# Patient Record
Sex: Male | Born: 1965 | Race: White | Hispanic: No | Marital: Married | Smoking: Current every day smoker
Health system: Southern US, Community
[De-identification: ages and names within clinical notes are randomized; demographics above are authoritative.]

## PROBLEM LIST (undated history)

## (undated) DIAGNOSIS — F319 Bipolar disorder, unspecified: Secondary | ICD-10-CM

---

## 2005-05-25 ENCOUNTER — Emergency Department: Payer: Self-pay | Admitting: Emergency Medicine

## 2006-07-17 ENCOUNTER — Other Ambulatory Visit: Payer: Self-pay

## 2006-07-17 ENCOUNTER — Emergency Department: Payer: Self-pay | Admitting: Emergency Medicine

## 2006-07-17 ENCOUNTER — Ambulatory Visit: Payer: Self-pay | Admitting: Internal Medicine

## 2013-09-02 LAB — CBC
HCT: 45.4 % (ref 40.0–52.0)
HGB: 15 g/dL (ref 13.0–18.0)
MCH: 28 pg (ref 26.0–34.0)
MCHC: 33 g/dL (ref 32.0–36.0)
MCV: 85 fL (ref 80–100)
Platelet: 241 10*3/uL (ref 150–440)
RBC: 5.35 10*6/uL (ref 4.40–5.90)
RDW: 13.7 % (ref 11.5–14.5)
WBC: 11.3 10*3/uL — ABNORMAL HIGH (ref 3.8–10.6)

## 2013-09-02 LAB — TROPONIN I

## 2013-09-02 LAB — BASIC METABOLIC PANEL
Anion Gap: 6 — ABNORMAL LOW (ref 7–16)
BUN: 22 mg/dL — AB (ref 7–18)
CALCIUM: 8.6 mg/dL (ref 8.5–10.1)
CO2: 28 mmol/L (ref 21–32)
Chloride: 101 mmol/L (ref 98–107)
Creatinine: 0.96 mg/dL (ref 0.60–1.30)
EGFR (African American): >60
EGFR (Non-African Amer.): >60
GLUCOSE: 99 mg/dL (ref 65–99)
OSMOLALITY: 273 (ref 275–301)
Potassium: 3.6 mmol/L (ref 3.5–5.1)
SODIUM: 135 mmol/L — AB (ref 136–145)

## 2013-09-02 LAB — PRO B NATRIURETIC PEPTIDE: B-Type Natriuretic Peptide: 15 pg/mL (ref 0–125)

## 2013-09-03 ENCOUNTER — Observation Stay: Payer: Self-pay | Admitting: Internal Medicine

## 2013-09-03 DIAGNOSIS — R079 Chest pain, unspecified: Secondary | ICD-10-CM

## 2013-09-03 LAB — TROPONIN I

## 2013-09-03 LAB — DRUG SCREEN, URINE
AMPHETAMINES, UR SCREEN: NEGATIVE (ref ?–1000)
Barbiturates, Ur Screen: NEGATIVE (ref ?–200)
Benzodiazepine, Ur Scrn: NEGATIVE (ref ?–200)
Cannabinoid 50 Ng, Ur ~~LOC~~: NEGATIVE (ref ?–50)
Cocaine Metabolite,Ur ~~LOC~~: NEGATIVE (ref ?–300)
MDMA (Ecstasy)Ur Screen: NEGATIVE (ref ?–500)
Methadone, Ur Screen: NEGATIVE (ref ?–300)
OPIATE, UR SCREEN: POSITIVE (ref ?–300)
Phencyclidine (PCP) Ur S: NEGATIVE (ref ?–25)
TRICYCLIC, UR SCREEN: NEGATIVE (ref ?–1000)

## 2013-09-03 LAB — CK TOTAL AND CKMB (NOT AT ARMC)
CK, TOTAL: 18801 U/L — AB
CK, TOTAL: 17285 U/L — AB
CK, Total: 16185 U/L — ABNORMAL HIGH
CK-MB: 5.2 ng/mL — ABNORMAL HIGH (ref 0.5–3.6)
CK-MB: 5 ng/mL — ABNORMAL HIGH (ref 0.5–3.6)
CK-MB: 4.8 ng/mL — ABNORMAL HIGH (ref 0.5–3.6)

## 2013-09-03 LAB — LIPID PANEL
Cholesterol: 196 mg/dL (ref 0–200)
HDL Cholesterol: 35 mg/dL — ABNORMAL LOW (ref 40–60)
LDL CHOLESTEROL, CALC: 140 mg/dL — AB (ref 0–100)
Triglycerides: 105 mg/dL (ref 0–200)
VLDL Cholesterol, Calc: 21 mg/dL (ref 5–40)

## 2013-09-03 LAB — WBC: WBC: 12.1 10*3/uL — AB (ref 3.8–10.6)

## 2013-09-03 LAB — SODIUM: Sodium: 139 mmol/L (ref 136–145)

## 2014-06-24 ENCOUNTER — Ambulatory Visit
Admit: 2014-06-24 | Disposition: A | Discharge status: Home / Self Care | Payer: Self-pay | Attending: Otolaryngology | Admitting: Otolaryngology

## 2014-07-10 NOTE — Discharge Summary (Signed)
PATIENT NAME:  Kyle Wood, Kyle Wood MR#:  [id number] DATE OF BIRTH:  1965/05/30  DATE OF ADMISSION:  09/03/2013 DATE OF DISCHARGE:  09/03/2013  ADMITTING DIAGNOSIS:  Chest pain.   DISCHARGE DIAGNOSES:  1.  Chest pain of unclear etiology at this time.  2.  Rhabdomyolysis due to exercise. 3.  Dehydration.  4.  Hyponatremia.  5.  Leukocytosis, might be stress reaction.  6.  Tobacco abuse.  7.  Hyperlipidemia.   DISCHARGE CONDITION:  Stable.   DISCHARGE MEDICATIONS:  The patient is to continue NicoDerm CQ 14 mg topically daily and nicotine oral inhaler 10 mg every two hours as needed.   HOME OXYGEN:  None.   DIET:  2 gram salt, low fat, low cholesterol.  The patient was advised to drink plenty of fluids over the next 2 or 3 days to have good urinary output.  Diet consistency is regular.   ACTIVITY LIMITATIONS:  As tolerated.   FOLLOWUP APPOINTMENT:  With Dr. Burnett ShengHedrick in two days after discharge.   CONSULTANTS:  Care management, social work.   RADIOLOGIC STUDIES:  Chest x-ray, portable, single view, 09/02/2013, revealed no active disease.  Myoview stress test, 09/03/2013, exercise myocardial perfusion study with no significant ischemia.  No significant wall motion abnormality noted.  Normal study with excellent exercise tolerance.  Overall this is a low risk scan.  The estimated ejection fraction was 73%.  The left ventricular global function was normal.  There were no EKG changes concerning for ischemia.  There was no artifact noted on the study.   HOSPITAL COURSE:  The patient is a 49 year old Caucasian male with no significant past medical history who presents to the hospital with complaints of chest pains.  Please refer to Dr. Jarrett SohoHower's admission note on 09/03/2013.  On arrival to the hospital, the patient's temperature was 98.8, pulse was 79, respiratory rate was 18, blood pressure 128/72, saturation was 95% on room air.  Physical exam was unremarkable.  The patient's lab data done on arrival to  the hospital 09/02/2013, showed elevation of BUN to 22, sodium 135, otherwise BMP was unremarkable.  The patient's cardiac enzymes, CK total was markedly elevated to 18,801, MB fraction was 5.2.  Troponin was less than 0.02.  The second set revealed a CK total of 17,285 with MB fraction of 5.0 and normal troponin less than 0.02.  Third set, CK total was 16,185 and MB fraction was 4.8 with again normal troponin of less than 0.02.  Urine drug screen was positive for opiates.  However, the patient's urine drug screen was performed only on 09/03/2013.  White blood cell count was slightly elevated to 11.3, hemoglobin was 15.0, platelet count was 241.  EKG showed normal sinus rhythm at 77 beats per minute, normal axis.  No acute ST-T changes were noted.  The patient was admitted to the hospital for further evaluation.  He was started on IV fluids because of rhabdomyolysis and his cardiac enzymes were cycled.  He underwent Myoview stress test on 09/03/2013 which was unremarkable for cardiac ischemia.  It was felt that the patient's chest pain was unlikely cardiac and possible musculoskeletal due to mild rhabdomyolysis.  The patient was advised to drink plenty of fluids to facilitate rhabdomyolysis recovery.  Again, urine drug screen was unremarkable.  Other risk factors for cardiac disease were investigated and the patient had lipid panel checked while he was in the hospital.  The patient's lipid panel revealed LDL of 140, total cholesterol was 196, triglycerides were 105 and  HDL was 35.  The patient was advised to continue low-fat, low-cholesterol diet.    In regards to tobacco abuse, he was counseled and recommended to quit.  Nicotine replacement therapy was initiated upon discharge.   In regards to hyponatremia, the patient's hyponatremia resolved with IV fluid administration.    In regards to leukocytosis, it was felt to be stress related reaction, possibly due to rhabdomyolysis.  It is recommended to follow the  patient's white blood cell count as outpatient to ensure its normalization.    The patient is being discharged in stable condition with the above-mentioned medications and follow-up.  On the day of discharge, the patient's vital signs, temperature was 97.4, pulse was 80, respiratory rate was 20, blood pressure 114/79, saturation was 96% on room air at rest.   TIME SPENT:  40 minutes.   ____________________________ Katharina Caper, MD rv:ea D: 09/03/2013 19:12:29 ET T: 09/04/2013 03:58:25 ET JOB#: [id number]  cc: Katharina Caper, MD, <Dictator> Rhona Leavens. Burnett Sheng, MD Katharina Caper MD ELECTRONICALLY SIGNED 09/12/2013 17:20

## 2014-07-10 NOTE — H&P (Signed)
PATIENT NAME:  Kyle Wood, Kyle Wood MR#:  [id number] DATE OF BIRTH:  1965/05/25  DATE OF ADMISSION:  09/03/2013  REFERRING PHYSICIAN: Mindi JunkerGottlieb.   PRIMARY CARE PHYSICIAN: Hedrick.   CHIEF COMPLAINT: Chest pain.   HISTORY OF PRESENT ILLNESS: A 49 year old Caucasian gentleman without significant past medical history presenting with chest pain. Describes acute onset and duration of chest pain which was retrosternal in location, pressure in quality, nonradiating, 10 out of 10 intensity. No worsening or relieving factors. Lasting approximately 30 minutes in total. Had associated diaphoresis. Received aspirin as well as nitroglycerin by EMS. In the Emergency Department, had resurgence of symptoms, though milder in nature, and at that time, nitro paste was placed. He is currently not complaining of chest pain.   REVIEW OF SYSTEMS:   CONSTITUTIONAL: Denies fever, fatigue, weakness.  EYES: Denied blurred vision, double vision, eye pain.  EARS, NOSE, THROAT: Denies tinnitus, ear pain, hearing loss.  RESPIRATORY: Denies cough, wheeze or shortness of breath.  CARDIOVASCULAR: Positive for chest pain as described above; however, denies any orthopnea, edema.  GASTROINTESTINAL: Denies nausea, vomiting, diarrhea, abdominal pain.  GENITOURINARY: Denies dysuria or hematuria.  ENDOCRINE: Denies nocturia or thyroid problems.  HEMATOLOGIC AND LYMPHATIC: Denies easy bruising or bleeding.  SKIN: Denies rash or lesions.  MUSCULOSKELETAL: Denies pain in neck, back, shoulder, knees, hips or arthritic symptoms.  NEUROLOGIC: Denies any paralysis or paresthesias.  PSYCHIATRIC: Denies anxiety or depressive symptoms.   Otherwise, full review of systems performed by me is negative.   PAST MEDICAL HISTORY: None.   SOCIAL HISTORY: Positive for tobacco use as well as occasional alcohol use. Denies drug use.   FAMILY HISTORY: Denies any known cardiovascular or pulmonary disorders.   ALLERGIES: CEFTIN, AS WELL AS CONTRAST DYE.    HOME MEDICATIONS: None.   PHYSICAL EXAMINATION:  VITAL SIGNS: Temperature 98.8, heart rate 79, respirations 18, blood pressure 128/72, saturating 95% on room air. Weight 88.5 kg, BMI 29.7.  GENERAL: Well-nourished, well-developed, Caucasian gentleman, currently in no acute distress.  HEAD: Normocephalic, atraumatic.  EYES: Pupils equal, round and reactive to light. Extraocular muscles intact. No scleral icterus.  MOUTH: Moist mucosal membranes. Dentition intact. No abscess noted.  EARS, NOSE, THROAT: Clear without exudates. No external lesions.  NECK: Supple. No thyromegaly. No nodules. No JVD.  PULMONARY: Clear to auscultation bilaterally without wheezes, rubs or rhonchi. No use of accessory muscles. Good respiratory effort.  CHEST: Nontender to palpation.  CARDIOVASCULAR: S1, S2, regular rate and rhythm. No murmurs, rubs or gallops. No edema. Pedal pulses 2+ bilaterally.  GASTROINTESTINAL: Soft, nontender, nondistended. No masses. Positive bowel sounds. No hepatosplenomegaly.  MUSCULOSKELETAL: No swelling, clubbing or edema. Range of motion full in all extremities.  NEUROLOGIC: Cranial nerves II through XII intact. No gross focal neurological deficits. Sensation intact. Reflexes intact.  SKIN: No ulceration, lesions, rashes or cyanosis. Skin warm, dry. Turgor intact.  PSYCHIATRIC: Mood and affect within normal limits. The patient is awake, alert, oriented x 3. Insight and judgment intact.   LABORATORY DATA: EKG performed. Normal sinus rhythm with heart rate 77. No ST or T wave abnormalities. Remainder of laboratory data: Sodium 135, potassium 3.6, chloride 101, bicarb 28, BUN 22, creatinine 0.96, glucose 99. Troponin I less than 0.02. WBC 11.3, hemoglobin 15, platelets of 241.   ASSESSMENT AND PLAN: A 49 year old gentleman without significant past medical history presenting with chest pain.  1. Chest pain: Admit to telemetry under observational status. Trend cardiac enzymes x 3. Initiate  aspirin and statin therapy as well as  p.r.n. nitroglycerin.  2. Hyponatremia: Gentle intravenous fluid hydration. Follow sodium levels.  3. Leukocytosis: No evidence of infection. No indication for antibiotics at this time.  4. Venous thromboembolism prophylaxis with heparin subcutaneous.   The patient is FULL CODE.   TIME SPENT: 45 minutes.   ____________________________ Cletis Athens. Hower, MD dkh:gb D: 09/02/2013 23:49:16 ET T: 09/03/2013 01:36:59 ET JOB#: [id number]  cc: Cletis Athens. Hower, MD, <Dictator> Xiong Synetta Shadow MD ELECTRONICALLY SIGNED 09/03/2013 20:26

## 2015-10-07 ENCOUNTER — Encounter: Payer: Self-pay | Admitting: *Deleted

## 2015-12-05 ENCOUNTER — Encounter: Admission: RE | Payer: Self-pay | Source: Ambulatory Visit

## 2015-12-05 ENCOUNTER — Ambulatory Visit
Admission: RE | Admit: 2015-12-05 | Payer: BLUE CROSS/BLUE SHIELD | Source: Ambulatory Visit | Admitting: Gastroenterology

## 2015-12-05 HISTORY — DX: Bipolar disorder, unspecified: F31.9

## 2015-12-05 SURGERY — COLONOSCOPY WITH PROPOFOL
Anesthesia: General

## 2021-03-19 HISTORY — PX: APPENDECTOMY: SHX54

## 2021-04-15 ENCOUNTER — Emergency Department: Payer: BC Managed Care – PPO

## 2021-04-15 DIAGNOSIS — F17210 Nicotine dependence, cigarettes, uncomplicated: Secondary | ICD-10-CM | POA: Insufficient documentation

## 2021-04-15 DIAGNOSIS — R0789 Other chest pain: Secondary | ICD-10-CM | POA: Diagnosis present

## 2021-04-15 DIAGNOSIS — I4510 Unspecified right bundle-branch block: Secondary | ICD-10-CM | POA: Diagnosis not present

## 2021-04-15 DIAGNOSIS — F319 Bipolar disorder, unspecified: Secondary | ICD-10-CM | POA: Insufficient documentation

## 2021-04-15 DIAGNOSIS — Z20822 Contact with and (suspected) exposure to covid-19: Secondary | ICD-10-CM | POA: Diagnosis not present

## 2021-04-15 LAB — CBC
HCT: 46.7 % (ref 39.0–52.0)
Hemoglobin: 15.5 g/dL (ref 13.0–17.0)
MCH: 27.6 pg (ref 26.0–34.0)
MCHC: 33.2 g/dL (ref 30.0–36.0)
MCV: 83.1 fL (ref 80.0–100.0)
Platelets: 267 10*3/uL (ref 150–400)
RBC: 5.62 MIL/uL (ref 4.22–5.81)
RDW: 13.1 % (ref 11.5–15.5)
WBC: 9.5 10*3/uL (ref 4.0–10.5)
nRBC: 0 % (ref 0.0–0.2)

## 2021-04-15 LAB — BASIC METABOLIC PANEL
Anion gap: 9 (ref 5–15)
BUN: 21 mg/dL — ABNORMAL HIGH (ref 6–20)
CO2: 26 mmol/L (ref 22–32)
Calcium: 9.3 mg/dL (ref 8.9–10.3)
Chloride: 99 mmol/L (ref 98–111)
Creatinine, Ser: 0.92 mg/dL (ref 0.61–1.24)
GFR, Estimated: >60 mL/min (ref >60)
Glucose, Bld: 126 mg/dL — ABNORMAL HIGH (ref 70–99)
Potassium: 3.7 mmol/L (ref 3.5–5.1)
Sodium: 134 mmol/L — ABNORMAL LOW (ref 135–145)

## 2021-04-15 LAB — TROPONIN I (HIGH SENSITIVITY): Troponin I (High Sensitivity): 5 ng/L (ref <18)

## 2021-04-15 NOTE — ED Triage Notes (Signed)
Pt states he was sitting at home tonight around 2100 when he started having right sided chest pain that radiated into his right neck. He was brought in by EMS and received 324mg  of ASA and 1 Nitro spray which brought his pain from a 6/10 to a 1/10.

## 2021-04-16 ENCOUNTER — Encounter: Payer: Self-pay | Admitting: Family Medicine

## 2021-04-16 ENCOUNTER — Other Ambulatory Visit: Payer: Self-pay

## 2021-04-16 ENCOUNTER — Observation Stay
Admission: EM | Admit: 2021-04-16 | Discharge: 2021-04-17 | Disposition: A | Discharge status: Home / Self Care | Payer: BC Managed Care – PPO | Attending: Osteopathic Medicine | Admitting: Osteopathic Medicine

## 2021-04-16 DIAGNOSIS — F319 Bipolar disorder, unspecified: Secondary | ICD-10-CM | POA: Diagnosis not present

## 2021-04-16 DIAGNOSIS — Z9889 Other specified postprocedural states: Secondary | ICD-10-CM

## 2021-04-16 DIAGNOSIS — R079 Chest pain, unspecified: Secondary | ICD-10-CM | POA: Diagnosis not present

## 2021-04-16 DIAGNOSIS — Z720 Tobacco use: Secondary | ICD-10-CM | POA: Diagnosis not present

## 2021-04-16 DIAGNOSIS — I4510 Unspecified right bundle-branch block: Secondary | ICD-10-CM

## 2021-04-16 LAB — RESP PANEL BY RT-PCR (FLU A&B, COVID) ARPGX2
Influenza A by PCR: NEGATIVE
Influenza B by PCR: NEGATIVE
SARS Coronavirus 2 by RT PCR: NEGATIVE

## 2021-04-16 LAB — HIV ANTIBODY (ROUTINE TESTING W REFLEX): HIV Screen 4th Generation wRfx: NONREACTIVE

## 2021-04-16 LAB — TROPONIN I (HIGH SENSITIVITY): Troponin I (High Sensitivity): 4 ng/L (ref <18)

## 2021-04-16 MED ORDER — ACETAMINOPHEN 325 MG PO TABS: 650.0000 mg | ORAL_TABLET | ORAL | Status: DC | PRN

## 2021-04-16 MED ORDER — ONDANSETRON HCL 4 MG/2ML IJ SOLN: 4.0000 mg | Freq: Four times a day (QID) | INTRAMUSCULAR | Status: DC | PRN

## 2021-04-16 MED ORDER — ENOXAPARIN SODIUM 60 MG/0.6ML IJ SOSY
0.5000 mg/kg | PREFILLED_SYRINGE | INTRAMUSCULAR | Status: DC
  Administered 2021-04-16: 47.5 mg via SUBCUTANEOUS
  Filled 2021-04-16: qty 0.6
  Filled 2021-04-16 (×2): qty 0.47

## 2021-04-16 MED ORDER — FAMOTIDINE IN NACL 20-0.9 MG/50ML-% IV SOLN
20.0000 mg | Freq: Two times a day (BID) | INTRAVENOUS | Status: AC
  Administered 2021-04-16 (×2): 20 mg via INTRAVENOUS
  Filled 2021-04-16 (×2): qty 50

## 2021-04-16 MED ORDER — SODIUM CHLORIDE 0.9 % IV SOLN
250.0000 mL | INTRAVENOUS | Status: DC | PRN
  Administered 2021-04-16: 250 mL via INTRAVENOUS

## 2021-04-16 MED ORDER — SODIUM CHLORIDE 0.9% FLUSH: 3.0000 mL | INTRAVENOUS | Status: DC | PRN

## 2021-04-16 MED ORDER — LIDOCAINE VISCOUS HCL 2 % MT SOLN
15.0000 mL | Freq: Once | OROMUCOSAL | Status: AC
  Administered 2021-04-16: 15 mL via ORAL
  Filled 2021-04-16: qty 15

## 2021-04-16 MED ORDER — SODIUM CHLORIDE 0.9 % WEIGHT BASED INFUSION
1.0000 mL/kg/h | INTRAVENOUS | Status: DC
  Administered 2021-04-17 (×2): 1 mL/kg/h via INTRAVENOUS

## 2021-04-16 MED ORDER — ASPIRIN 81 MG PO CHEW
81.0000 mg | CHEWABLE_TABLET | ORAL | Status: AC
  Administered 2021-04-17: 81 mg via ORAL
  Filled 2021-04-16: qty 1

## 2021-04-16 MED ORDER — ALUM & MAG HYDROXIDE-SIMETH 200-200-20 MG/5ML PO SUSP
30.0000 mL | Freq: Once | ORAL | Status: AC
  Administered 2021-04-16: 30 mL via ORAL
  Filled 2021-04-16: qty 30

## 2021-04-16 MED ORDER — ASPIRIN EC 81 MG PO TBEC
81.0000 mg | DELAYED_RELEASE_TABLET | Freq: Every day | ORAL | Status: DC
  Administered 2021-04-16: 81 mg via ORAL
  Filled 2021-04-16: qty 1

## 2021-04-16 MED ORDER — DIPHENHYDRAMINE HCL 25 MG PO CAPS: 50.0000 mg | ORAL_CAPSULE | Freq: Four times a day (QID) | ORAL | Status: DC | PRN

## 2021-04-16 MED ORDER — ALPRAZOLAM 0.25 MG PO TABS: 0.2500 mg | ORAL_TABLET | Freq: Two times a day (BID) | ORAL | Status: DC | PRN

## 2021-04-16 MED ORDER — MORPHINE SULFATE (PF) 2 MG/ML IV SOLN: 2.0000 mg | INTRAVENOUS | Status: DC | PRN

## 2021-04-16 MED ORDER — TRAZODONE HCL 50 MG PO TABS: 25.0000 mg | ORAL_TABLET | Freq: Every evening | ORAL | Status: DC | PRN

## 2021-04-16 MED ORDER — SODIUM CHLORIDE 0.9 % WEIGHT BASED INFUSION
3.0000 mL/kg/h | INTRAVENOUS | Status: AC
  Administered 2021-04-17: 3 mL/kg/h via INTRAVENOUS

## 2021-04-16 MED ORDER — SODIUM CHLORIDE 0.9 % IV SOLN: INTRAVENOUS | Status: DC

## 2021-04-16 MED ORDER — SODIUM CHLORIDE 0.9% FLUSH
3.0000 mL | Freq: Two times a day (BID) | INTRAVENOUS | Status: DC
  Administered 2021-04-16 – 2021-04-17 (×2): 3 mL via INTRAVENOUS

## 2021-04-16 MED ORDER — NITROGLYCERIN 0.4 MG SL SUBL: 0.4000 mg | SUBLINGUAL_TABLET | SUBLINGUAL | Status: DC | PRN

## 2021-04-16 MED ORDER — PREDNISONE 50 MG PO TABS
60.0000 mg | ORAL_TABLET | Freq: Four times a day (QID) | ORAL | Status: AC
  Administered 2021-04-16 – 2021-04-17 (×4): 60 mg via ORAL
  Filled 2021-04-16: qty 3
  Filled 2021-04-16 (×3): qty 1

## 2021-04-16 MED ORDER — MAGNESIUM HYDROXIDE 400 MG/5ML PO SUSP: 30.0000 mL | Freq: Every day | ORAL | Status: DC | PRN

## 2021-04-16 NOTE — ED Notes (Signed)
Report to beth, rn

## 2021-04-16 NOTE — H&P (Signed)
[place]   PATIENT NAME: Kyle Wood    MR#:  [id number]  DATE OF BIRTH:  1966-01-29  DATE OF ADMISSION:  04/16/2021  PRIMARY CARE PHYSICIAN: No primary care provider on file.   Patient is coming from: Home  REQUESTING/REFERRING PHYSICIAN: Ward, Cyril Mourning, DO  CHIEF COMPLAINT:   Chief Complaint  Patient presents with   Chest Pain    HISTORY OF PRESENT ILLNESS:  Kyle Wood is a 56 y.o. Caucasian male with medical history significant for bipolar 1 disorder, ongoing tobacco abuse and obesity, who presented to the ER with acute onset of midsternal and right parasternal chest pain felt as tightness and pressure and graded 10/10 in severity with radiation to his neck and jaw.  He was noted to be flushed and pale with her chest pain.  She was given 1 aspirin and a spray of sublingual nitroglycerin by EMS that resolved the pain.  No reported nausea or vomiting or abdominal pain.  No bleeding diathesis.  No dyspnea or cough or wheezing or hemoptysis.  No leg pain or edema or recent travels or surgeries.  No dysuria, oliguria or hematuria or flank pain.  EKG as reviewed by me : EKG showed normal sinus rhythm rate of 83 with right bundle branch block.  Labs revealed mild hyponatremia.  High-sensitivity troponin I was 504 and CBC was within normal. Imaging: 2 view chest x-ray showed no acute cardiopulmonary disease.  The patient will be admitted to an observation cardiac telemetry bed for further evaluation and management. PAST MEDICAL HISTORY:   Past Medical History:  Diagnosis Date   Bipolar 1 disorder ([place])   -Ongoing tobacco abuse - Obesity  PAST SURGICAL HISTORY:  No past surgical history on file.  No previous surgeries. SOCIAL HISTORY:   Social History   Tobacco Use   Smoking status: Every Day    Packs/day: 0.50    Years: 34.00    Pack years: 17.00    Types: Cigarettes   Smokeless tobacco: Never  Substance Use Topics   Alcohol use: Yes    FAMILY HISTORY:   Positive for cancer. DRUG ALLERGIES:   Allergies  Allergen Reactions   Ceftin [Cefuroxime Axetil] Nausea Only    REVIEW OF SYSTEMS:   ROS As per history of present illness. All pertinent systems were reviewed above. Constitutional, HEENT, cardiovascular, respiratory, GI, GU, musculoskeletal, neuro, psychiatric, endocrine, integumentary and hematologic systems were reviewed and are otherwise negative/unremarkable except for positive findings mentioned above in the HPI.   MEDICATIONS AT HOME:   Prior to Admission medications   Medication Sig Start Date End Date Taking? Authorizing Provider  meloxicam (MOBIC) 15 MG tablet Take 15 mg by mouth daily.   Yes [provider]      VITAL SIGNS:  Blood pressure 125/81, pulse 69, temperature 98.7 F (37.1 C), temperature source Oral, resp. rate 17, height 5\' 7"  (1.702 m), weight 92.5 kg, SpO2 95 %.  PHYSICAL EXAMINATION:  Physical Exam  GENERAL:  56 y.o.-year-old Caucasian male patient lying in the bed with no acute distress.  EYES: Pupils equal, round, reactive to light and accommodation. No scleral icterus. Extraocular muscles intact.  HEENT: Head atraumatic, normocephalic. Oropharynx and nasopharynx clear.  NECK:  Supple, no jugular venous distention. No thyroid enlargement, no tenderness.  LUNGS: Normal breath sounds bilaterally, no wheezing, rales,rhonchi or crepitation. No use of accessory muscles of respiration.  CARDIOVASCULAR: Regular rate and rhythm, S1, S2 normal. No murmurs, rubs, or gallops.  ABDOMEN:  Soft, nondistended, nontender. Bowel sounds present. No organomegaly or mass.  EXTREMITIES: No pedal edema, cyanosis, or clubbing.  NEUROLOGIC: Cranial nerves II through XII are intact. Muscle strength 5/5 in all extremities. Sensation intact. Gait not checked.  PSYCHIATRIC: The patient is alert and oriented x 3.  Normal affect and good eye contact. SKIN: No obvious rash, lesion, or ulcer.   LABORATORY PANEL:    CBC Recent Labs  Lab 04/15/21 2258  WBC 9.5  HGB 15.5  HCT 46.7  PLT 267   ------------------------------------------------------------------------------------------------------------------  Chemistries  Recent Labs  Lab 04/15/21 2258  NA 134*  K 3.7  CL 99  CO2 26  GLUCOSE 126*  BUN 21*  CREATININE 0.92  CALCIUM 9.3   ------------------------------------------------------------------------------------------------------------------  Cardiac Enzymes No results for input(s): TROPONINI in the last 168 hours. ------------------------------------------------------------------------------------------------------------------  RADIOLOGY:  DG Chest 2 View  Result Date: 04/15/2021 CLINICAL DATA:  Chest pain EXAM: CHEST - 2 VIEW COMPARISON:  09/02/2013 FINDINGS: The heart size and mediastinal contours are within normal limits. Both lungs are clear. The visualized skeletal structures are unremarkable. IMPRESSION: No active cardiopulmonary disease. Electronically Signed   By: Inez Catalina M.D.   On: 04/15/2021 23:08      IMPRESSION AND PLAN:  Principal Problem:   Chest pain  1.  Chest pain, rule out acute coronary syndrome. - The patient will be admitted to an observation cardiac telemetry bed. - We will follow serial troponins. - The patient will be placed on aspirin as well as as needed sublingual nitroglycerin and morphine sulfate for pain. - Cardiology consult will be obtained. - I notified Dr. Clayborn Bigness about the patient.  2.  Bipolar 1 disorder. - This is apparently fairly controlled.  3.  Ongoing tobacco abuse. - I counseled the patient for smoking cessation and he will receive further counseling here.  DVT prophylaxis: Lovenox. Code Status: full code. Family Communication:  The plan of care was discussed in details with the patient (and family). I answered all questions. The patient agreed to proceed with the above mentioned plan. Further management will depend  upon hospital course. Disposition Plan: Back to previous home environment Consults called: Cardiology. All the records are reviewed and case discussed with ED provider.  Status is: Observation  I certify that at the time of admission, it is my clinical judgment that the patient will require inpatient hospital care extending less than 2 midnights.                            Dispo: The patient is from: Home              Anticipated d/c is to: Home              Patient currently is not medically stable to d/c.              Difficult to place patient: No  Christel Mormon M.D on 04/16/2021 at 2:24 AM  [facility]   From 7 PM-7 AM, contact night-coverage [email or web address]  CC: Primary care physician; No primary care provider on file.

## 2021-04-16 NOTE — ED Provider Notes (Signed)
[facility] Provider Note    Event Date/Time   First MD Initiated Contact with Patient 04/16/21 0117     (approximate)   History   Chest Pain   HPI  Kyle Wood is a 56 y.o. male with history of bipolar disorder, obesity, tobacco use, hyperlipidemia who presents to the emergency department with his wife with complaints of chest discomfort that started around 9:30 PM while at rest.  Describes it as a squeezing pain mostly in the right side of his chest that radiated up into his jaw.  He appeared very flushed per his wife and then was pale.  No shortness of breath, nausea, vomiting, diaphoresis, dizziness.  No fever or cough.  No history of PE, DVT, exogenous estrogen use, recent fractures, surgery, trauma, hospitalization, prolonged travel or other immobilization. No lower extremity swelling or pain. No calf tenderness.  Was given aspirin and nitroglycerin with EMS and his pain has completely resolved.   History provided by patient and wife.    Past Medical History:  Diagnosis Date   Bipolar 1 disorder ([facility])     No past surgical history on file.  MEDICATIONS:  Prior to Admission medications   Medication Sig Start Date End Date Taking? Authorizing Provider  meloxicam (MOBIC) 15 MG tablet Take 15 mg by mouth daily.    [provider]    Physical Exam   Triage Vital Signs: ED Triage Vitals  Enc Vitals Group     BP 04/15/21 2247 124/80     Pulse Rate 04/15/21 2247 82     Resp 04/15/21 2247 18     Temp 04/15/21 2247 98.7 F (37.1 C)     Temp Source 04/15/21 2247 Oral     SpO2 04/15/21 2247 95 %     Weight 04/15/21 2254 204 lb (92.5 kg)     Height --      Head Circumference --      Peak Flow --      Pain Score 04/15/21 2248 1     Pain Loc --      Pain Edu? --      Excl. in GC? --     Most recent vital signs: Vitals:   04/15/21 2247 04/16/21 0111  BP: 124/80 125/81  Pulse: 82 69  Resp: 18 17  Temp: 98.7 F (37.1 C)   SpO2:  95%     CONSTITUTIONAL: Alert and oriented and responds appropriately to questions. Well-appearing; well-nourished HEAD: Normocephalic, atraumatic EYES: Conjunctivae clear, pupils appear equal, sclera nonicteric ENT: normal nose; moist mucous membranes NECK: Supple, normal ROM CARD: RRR; S1 and S2 appreciated; no murmurs, no clicks, no rubs, no gallops RESP: Normal chest excursion without splinting or tachypnea; breath sounds clear and equal bilaterally; no wheezes, no rhonchi, no rales, no hypoxia or respiratory distress, speaking full sentences ABD/GI: Normal bowel sounds; non-distended; soft, non-tender, no rebound, no guarding, no peritoneal signs BACK: The back appears normal EXT: Normal ROM in all joints; no deformity noted, no edema; no cyanosis, no calf tenderness or calf swelling SKIN: Normal color for age and race; warm; no rash on exposed skin NEURO: Moves all extremities equally, normal speech PSYCH: The patient's mood and manner are appropriate.   ED Results / Procedures / Treatments   LABS: (all labs ordered are listed, but only abnormal results are displayed) Labs Reviewed  BASIC METABOLIC PANEL - Abnormal; Notable for the following components:      Result Value   Sodium  134 (*)    Glucose, Bld 126 (*)    BUN 21 (*)    All other components within normal limits  CBC  TROPONIN I (HIGH SENSITIVITY)  TROPONIN I (HIGH SENSITIVITY)     EKG:  EKG Interpretation  Date/Time:  Saturday April 15 2021 22:45:07 EST Ventricular Rate:  83 PR Interval:  180 QRS Duration: 134 QT Interval:  376 QTC Calculation: 441 R Axis:   43 Text Interpretation: Normal sinus rhythm Right bundle branch block Abnormal ECG When compared with ECG of 03-Sep-2013 07:14, Right bundle branch block is now Present Confirmed by Rochele RaringWard, Elias Bordner [phone]) on 04/16/2021 1:33:12 AM         RADIOLOGY: My personal review and interpretation of imaging: Chest x-ray clear  I have personally reviewed  all radiology reports.   DG Chest 2 View  Result Date: 04/15/2021 CLINICAL DATA:  Chest pain EXAM: CHEST - 2 VIEW COMPARISON:  09/02/2013 FINDINGS: The heart size and mediastinal contours are within normal limits. Both lungs are clear. The visualized skeletal structures are unremarkable. IMPRESSION: No active cardiopulmonary disease. Electronically Signed   By: Alcide CleverMark  Lukens M.D.   On: 04/15/2021 23:08     PROCEDURES:  Critical Care performed: No      .1-3 Lead EKG Interpretation Performed by: Adeline Petitfrere, Layla MawKristen N, DO Authorized by: Joelene Barriere, Layla MawKristen N, DO     Interpretation: normal     ECG rate:  70   ECG rate assessment: normal     Rhythm: sinus rhythm     Ectopy: none     Conduction: normal      IMPRESSION / MDM / ASSESSMENT AND PLAN / ED COURSE  I reviewed the triage vital signs and the nursing notes.    Patient here with chest pain.  Pain is in the right side of his chest however he describes it as a squeezing pain and resolved with nitroglycerin.  Does have multiple risk factors for ACS.  No risk factors for PE.  The patient is on the cardiac monitor to evaluate for evidence of arrhythmia and/or significant heart rate changes.   DIFFERENTIAL DIAGNOSIS (includes but not limited to):   ACS, PE, dissection, esophageal spasm, musculoskeletal pain, pneumonia, pneumothorax   PLAN: Cardiac labs, EKG, chest x-ray, cardiac monitoring.   MEDICATIONS GIVEN IN ED: Medications - No data to display   ED COURSE: Patient's EKG shows a new right bundle branch block compared to previous in 2015.  He has had 2 negative troponins.  Chest x-ray reviewed by myself and radiologist is clear without pneumonia, pneumothorax, edema.  Normal hemoglobin, electrolytes.  Patient has a heart score of 5.  Discussed given his risk factors for ACS including BMI greater than 30, tobacco use, hyperlipidemia and new EKG abnormalities compared to 2015 that I recommend either observation in the hospital versus  close outpatient follow-up with cardiology.  Patient and wife would prefer admission to the hospital.  At this time he is chest pain-free and hemodynamically stable.  Will discuss with hospitalist for admission.  CONSULTS:  Consulted and discussed patient's case with hospitalist, Dr. Arville CareMansy.  I have recommended admission and consulting physician agrees and will place admission orders.  Patient (and family if present) agree with this plan.   I reviewed all nursing notes, vitals, pertinent previous records.  All labs, EKGs, imaging ordered have been independently reviewed and interpreted by myself.    OUTSIDE RECORDS REVIEWED: Patient denied previous stress test however on review of records it appears he had  an exercise myocardial perfusion study in June 2015 that showed no significant ischemia with an estimated ejection fraction of 73%.         FINAL CLINICAL IMPRESSION(S) / ED DIAGNOSES   Final diagnoses:  Nonspecific chest pain  RBBB     Rx / DC Orders   ED Discharge Orders     None        Note:  This document was prepared using Dragon voice recognition software and may include unintentional dictation errors.   Deondre Marinaro, Layla Maw, DO 04/16/21 [phone]

## 2021-04-16 NOTE — Progress Notes (Signed)
PHARMACIST - PHYSICIAN COMMUNICATION  CONCERNING:  Enoxaparin (Lovenox) for DVT Prophylaxis    RECOMMENDATION: Patient was prescribed enoxaprin 40mg  q24 hours for VTE prophylaxis.   Filed Weights   04/15/21 2254  Weight: 92.5 kg (204 lb)    Body mass index is 31.95 kg/m.  Estimated Creatinine Clearance: 98.4 mL/min (by C-G formula based on SCr of 0.92 mg/dL).   Based on [facility] policy patient is candidate for enoxaparin 0.5mg /kg TBW SQ every 24 hours based on BMI being >30.  DESCRIPTION: Pharmacy has adjusted enoxaparin dose per [facility] policy.  Patient is now receiving enoxaparin 0.5 mg/kg every 24 hours   [facility], PharmD, [facility] 04/16/2021 2:25 AM

## 2021-04-16 NOTE — ED Notes (Signed)
Patient moved from Flex room 45 to room 5. C/o center, epigastric substernal chest pressure. Rates pressure 1/10. A&Ox4. Skin p/w/d. RR even and nonlabored. Sinus Rhythm per cardiac monitor. RA. Wife present at bedside.

## 2021-04-16 NOTE — Progress Notes (Signed)
Hospital Day 0 H&P reviewed Chart reviewed - cardiology consult in place and planning for cath Patient seen and examined in ED, no complaints. NO additional questions.    Results for orders placed or performed during the hospital encounter of 04/16/21 (from the past 24 hour(s))  Basic metabolic panel     Status: Abnormal   Collection Time: 04/15/21 10:58 PM  Result Value Ref Range   Sodium 134 (L) 135 - 145 mmol/L   Potassium 3.7 3.5 - 5.1 mmol/L   Chloride 99 98 - 111 mmol/L   CO2 26 22 - 32 mmol/L   Glucose, Bld 126 (H) 70 - 99 mg/dL   BUN 21 (H) 6 - 20 mg/dL   Creatinine, Ser 0.92 0.61 - 1.24 mg/dL   Calcium 9.3 8.9 - 10.3 mg/dL   GFR, Estimated >60 >60 mL/min   Anion gap 9 5 - 15  CBC     Status: None   Collection Time: 04/15/21 10:58 PM  Result Value Ref Range   WBC 9.5 4.0 - 10.5 K/uL   RBC 5.62 4.22 - 5.81 MIL/uL   Hemoglobin 15.5 13.0 - 17.0 g/dL   HCT 46.7 39.0 - 52.0 %   MCV 83.1 80.0 - 100.0 fL   MCH 27.6 26.0 - 34.0 pg   MCHC 33.2 30.0 - 36.0 g/dL   RDW 13.1 11.5 - 15.5 %   Platelets 267 150 - 400 K/uL   nRBC 0.0 0.0 - 0.2 %  Troponin I (High Sensitivity)     Status: None   Collection Time: 04/15/21 10:58 PM  Result Value Ref Range   Troponin I (High Sensitivity) 5 <18 ng/L  Troponin I (High Sensitivity)     Status: None   Collection Time: 04/16/21  1:08 AM  Result Value Ref Range   Troponin I (High Sensitivity) 4 <18 ng/L  HIV Antibody (routine testing w rflx)     Status: None   Collection Time: 04/16/21  7:33 AM  Result Value Ref Range   HIV Screen 4th Generation wRfx Non Reactive Non Reactive  Resp Panel by RT-PCR (Flu A&B, Covid) Nasopharyngeal Swab     Status: None   Collection Time: 04/16/21  4:05 PM   Specimen: Nasopharyngeal Swab; Nasopharyngeal(NP) swabs in vial transport medium  Result Value Ref Range   SARS Coronavirus 2 by RT PCR NEGATIVE NEGATIVE   Influenza A by PCR NEGATIVE NEGATIVE   Influenza B by PCR NEGATIVE NEGATIVE   DG Chest 2  View  Result Date: 04/15/2021 CLINICAL DATA:  Chest pain EXAM: CHEST - 2 VIEW COMPARISON:  09/02/2013 FINDINGS: The heart size and mediastinal contours are within normal limits. Both lungs are clear. The visualized skeletal structures are unremarkable. IMPRESSION: No active cardiopulmonary disease. Electronically Signed   By: Inez Catalina M.D.   On: 04/15/2021 23:08    Meds ordered this encounter  Medications   acetaminophen (TYLENOL) tablet 650 mg   ondansetron (ZOFRAN) injection 4 mg   enoxaparin (LOVENOX) injection 47.5 mg   DISCONTD: 0.9 %  sodium chloride infusion   AND Linked Order Group    alum & mag hydroxide-simeth (MAALOX/MYLANTA) 200-200-20 MG/5ML suspension 30 mL    lidocaine (XYLOCAINE) 2 % viscous mouth solution 15 mL   aspirin EC tablet 81 mg   ALPRAZolam (XANAX) tablet 0.25 mg   traZODone (DESYREL) tablet 25 mg   magnesium hydroxide (MILK OF MAGNESIA) suspension 30 mL   nitroGLYCERIN (NITROSTAT) SL tablet 0.4 mg   morphine 2 MG/ML injection  2 mg   sodium chloride flush (NS) 0.9 % injection 3 mL   predniSONE (DELTASONE) tablet 60 mg   diphenhydrAMINE (BENADRYL) capsule 50 mg   famotidine (PEPCID) IVPB 20 mg premix

## 2021-04-16 NOTE — Consult Note (Signed)
CARDIOLOGY CONSULT NOTE               Patient ID: Kyle Wood MRN: [id number] DOB/AGE: 11-20-1965 56 y.o.  Admit date: 04/16/2021 Referring Physician Dr. Sunnie Nielsen hospitalist Primary Physician Dr. 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  Has improvement in chest discomfort but now here for further evaluation and management of possible unstable angina  Review of systems complete and found to be negative unless listed above     Past Medical History:  Diagnosis Date   Bipolar 1 disorder (HCC)     History reviewed. No pertinent surgical history.  (Not in a hospital admission)  Social History   Socioeconomic History   Marital status: Married    Spouse name: Not on file   Number of children: Not on file   Years of education: Not on file   Highest education level: Not on file  Occupational History   Not on file  Tobacco Use   Smoking status: Every Day    Packs/day: 0.50    Years: 34.00    Pack years: 17.00    Types: Cigarettes   Smokeless tobacco: Never  Substance and Sexual Activity   Alcohol use: Yes   Drug use: No   Sexual activity: Not on file  Other Topics Concern   Not on file  Social History Narrative   Not on file   Social Determinants of Health   Financial Resource Strain: Not on file  Food Insecurity: Not on file  Transportation Needs: Not on file  Physical Activity: Not on file  Stress: Not on file  Social Connections: Not on file  Intimate Partner Violence: Not on file    History reviewed. No pertinent family history.    Review of systems complete and found to be negative unless listed above      PHYSICAL EXAM  General: Well developed, well nourished, in no acute distress HEENT:   Normocephalic and atramatic Neck:  No JVD.  Lungs: Clear bilaterally to auscultation and percussion. Heart: HRRR . Normal S1 and S2 without gallops or murmurs.  Abdomen: Bowel sounds are positive, abdomen soft and non-tender  Msk:  Back normal, normal gait. Normal strength and tone for age. Extremities: No clubbing, cyanosis or edema.   Neuro: Alert and oriented X 3. Psych:  Good affect, responds appropriately  Labs:   Lab Results  Component Value Date   WBC 9.5 04/15/2021   HGB 15.5 04/15/2021   HCT 46.7 04/15/2021   MCV 83.1 04/15/2021   PLT 267 04/15/2021    Recent Labs  Lab 04/15/21 2258  NA 134*  K 3.7  CL 99  CO2 26  BUN 21*  CREATININE 0.92  CALCIUM 9.3  GLUCOSE 126*   Lab Results  Component Value Date   CKTOTAL 16,185 (H) 09/03/2013   CKMB 4.8 (H) 09/03/2013   TROPONINI < 0.02 09/03/2013    Lab Results  Component Value Date   CHOL 196 09/03/2013   Lab Results  Component Value Date   HDL 35 (L) 09/03/2013   Lab Results  Component Value Date   LDLCALC 140 (H) 09/03/2013   Lab Results  Component Value Date   TRIG 105 09/03/2013   No results found for: CHOLHDL No results found for: LDLDIRECT  Radiology: DG Chest 2 View  Result Date: 04/15/2021 CLINICAL DATA:  Chest pain EXAM: CHEST - 2 VIEW COMPARISON:  09/02/2013 FINDINGS: The heart size and mediastinal contours are within normal limits. Both lungs are clear. The visualized skeletal structures are unremarkable. IMPRESSION: No active cardiopulmonary disease. Electronically Signed   By: Alcide Clever M.D.   On: 04/15/2021 23:08    EKG: Normal sinus rhythm right bundle branch block nonspecific T2 changes  ASSESSMENT AND PLAN:  Unstable angina Chest pain Bipolar Smoking Obesity . Plan Continue telemetry Anticoagulation with heparin Follow-up troponins EKGs Cardiogram for evaluation of left ventricular function Recommend premedications for dye Commend cardiac cath prior to  discharge Advised patient to refrain from tobacco abuse   Signed: Alwyn Pea MD, 04/16/2021, 12:28 PM

## 2021-04-16 NOTE — ED Notes (Signed)
Pt states he had squeezing chest pain on the right side of chest that radiated to his back and neck. Pt is not c/o chest pain at the moment. Pt NAD at this time.

## 2021-04-17 ENCOUNTER — Encounter: Payer: Self-pay | Admitting: Family Medicine

## 2021-04-17 ENCOUNTER — Encounter
Admission: EM | Disposition: A | Discharge status: Home / Self Care | Payer: Self-pay | Source: Home / Self Care | Attending: Osteopathic Medicine

## 2021-04-17 DIAGNOSIS — R079 Chest pain, unspecified: Secondary | ICD-10-CM | POA: Diagnosis not present

## 2021-04-17 DIAGNOSIS — Z9889 Other specified postprocedural states: Secondary | ICD-10-CM

## 2021-04-17 DIAGNOSIS — I4510 Unspecified right bundle-branch block: Secondary | ICD-10-CM | POA: Diagnosis not present

## 2021-04-17 HISTORY — PX: LEFT HEART CATH AND CORONARY ANGIOGRAPHY: CATH118249

## 2021-04-17 HISTORY — DX: Other specified postprocedural states: Z98.890

## 2021-04-17 SURGERY — LEFT HEART CATH AND CORONARY ANGIOGRAPHY
Anesthesia: Moderate Sedation

## 2021-04-17 MED ORDER — HEPARIN SODIUM (PORCINE) 1000 UNIT/ML IJ SOLN
INTRAMUSCULAR | Status: AC
  Filled 2021-04-17: qty 10

## 2021-04-17 MED ORDER — IOHEXOL 300 MG/ML  SOLN
INTRAMUSCULAR | Status: DC | PRN
  Administered 2021-04-17: 48 mL

## 2021-04-17 MED ORDER — VERAPAMIL HCL 2.5 MG/ML IV SOLN
INTRAVENOUS | Status: AC
  Filled 2021-04-17: qty 2

## 2021-04-17 MED ORDER — LABETALOL HCL 5 MG/ML IV SOLN: 10.0000 mg | INTRAVENOUS | Status: DC | PRN

## 2021-04-17 MED ORDER — SODIUM CHLORIDE 0.9 % IV SOLN: 250.0000 mL | INTRAVENOUS | Status: DC | PRN

## 2021-04-17 MED ORDER — SODIUM CHLORIDE 0.9% FLUSH: 3.0000 mL | Freq: Two times a day (BID) | INTRAVENOUS | Status: DC

## 2021-04-17 MED ORDER — HEPARIN SODIUM (PORCINE) 1000 UNIT/ML IJ SOLN
INTRAMUSCULAR | Status: DC | PRN
  Administered 2021-04-17: 4500 [IU] via INTRAVENOUS

## 2021-04-17 MED ORDER — DIPHENHYDRAMINE HCL 50 MG/ML IJ SOLN
INTRAMUSCULAR | Status: AC
  Filled 2021-04-17: qty 1

## 2021-04-17 MED ORDER — ONDANSETRON HCL 4 MG/2ML IJ SOLN: 4.0000 mg | Freq: Four times a day (QID) | INTRAMUSCULAR | Status: DC | PRN

## 2021-04-17 MED ORDER — MIDAZOLAM HCL 2 MG/2ML IJ SOLN
INTRAMUSCULAR | Status: AC
  Filled 2021-04-17: qty 2

## 2021-04-17 MED ORDER — NITROGLYCERIN 0.4 MG SL SUBL: 0.4000 mg | SUBLINGUAL_TABLET | SUBLINGUAL | 0 refills | Status: AC | PRN

## 2021-04-17 MED ORDER — SODIUM CHLORIDE 0.9 % WEIGHT BASED INFUSION: 1.0000 mL/kg/h | INTRAVENOUS | Status: DC

## 2021-04-17 MED ORDER — HEPARIN (PORCINE) IN NACL 2000-0.9 UNIT/L-% IV SOLN
INTRAVENOUS | Status: DC | PRN
  Administered 2021-04-17: 1000 mL

## 2021-04-17 MED ORDER — DIPHENHYDRAMINE HCL 50 MG/ML IJ SOLN
INTRAMUSCULAR | Status: DC | PRN
  Administered 2021-04-17: 50 mg via INTRAVENOUS

## 2021-04-17 MED ORDER — VERAPAMIL HCL 2.5 MG/ML IV SOLN
INTRAVENOUS | Status: DC | PRN
  Administered 2021-04-17: 2.5 mg via INTRA_ARTERIAL

## 2021-04-17 MED ORDER — HYDRALAZINE HCL 20 MG/ML IJ SOLN: 10.0000 mg | INTRAMUSCULAR | Status: DC | PRN

## 2021-04-17 MED ORDER — FENTANYL CITRATE (PF) 100 MCG/2ML IJ SOLN
INTRAMUSCULAR | Status: AC
  Filled 2021-04-17: qty 2

## 2021-04-17 MED ORDER — MIDAZOLAM HCL 2 MG/2ML IJ SOLN
INTRAMUSCULAR | Status: DC | PRN
  Administered 2021-04-17: 1 mg via INTRAVENOUS

## 2021-04-17 MED ORDER — ACETAMINOPHEN 325 MG PO TABS: 650.0000 mg | ORAL_TABLET | ORAL | Status: DC | PRN

## 2021-04-17 MED ORDER — HEPARIN (PORCINE) IN NACL 1000-0.9 UT/500ML-% IV SOLN
INTRAVENOUS | Status: AC
  Filled 2021-04-17: qty 1000

## 2021-04-17 MED ORDER — FENTANYL CITRATE (PF) 100 MCG/2ML IJ SOLN
INTRAMUSCULAR | Status: DC | PRN
  Administered 2021-04-17: 25 ug via INTRAVENOUS

## 2021-04-17 MED ORDER — ASPIRIN 81 MG PO TBEC: 81.0000 mg | DELAYED_RELEASE_TABLET | Freq: Every day | ORAL | 0 refills | Status: AC

## 2021-04-17 MED ORDER — LIDOCAINE HCL (PF) 1 % IJ SOLN
INTRAMUSCULAR | Status: DC | PRN
  Administered 2021-04-17: 2 mL

## 2021-04-17 MED ORDER — SODIUM CHLORIDE 0.9% FLUSH: 3.0000 mL | INTRAVENOUS | Status: DC | PRN

## 2021-04-17 SURGICAL SUPPLY — 10 items
CATH 5FR JL3.5 JR4 ANG PIG MP (CATHETERS) ×1 IMPLANT
DEVICE RAD TR BAND REGULAR (VASCULAR PRODUCTS) ×1 IMPLANT
DRAPE BRACHIAL (DRAPES) ×1 IMPLANT
GLIDESHEATH SLEND SS 6F .021 (SHEATH) ×1 IMPLANT
GUIDEWIRE INQWIRE 1.5J.035X260 (WIRE) IMPLANT
INQWIRE 1.5J .035X260CM (WIRE) ×2
PACK CARDIAC CATH (CUSTOM PROCEDURE TRAY) ×2 IMPLANT
PROTECTION STATION PRESSURIZED (MISCELLANEOUS) ×2
SET ATX SIMPLICITY (MISCELLANEOUS) ×1 IMPLANT
STATION PROTECTION PRESSURIZED (MISCELLANEOUS) IMPLANT

## 2021-04-17 NOTE — Progress Notes (Signed)
Discharge instructions, RX's and follow up appts explained and provided to patient verbalized understanding. Patient left floor via wheelchair accompanied by staff no c/o pain or shortness of breath at d/c.  Rayshun Kandler Lynn, RN  

## 2021-04-17 NOTE — Plan of Care (Signed)
  Problem: Education: Goal: Knowledge of General Education information will improve Description Including pain rating scale, medication(s)/side effects and non-pharmacologic comfort measures Outcome: Progressing   Problem: Health Behavior/Discharge Planning: Goal: Ability to manage health-related needs will improve Outcome: Progressing   

## 2021-04-17 NOTE — OR Nursing (Signed)
Cath lab informed of pt report of throat swelling with ct contrast years ago. Reviewed meds  with Jame RN. He will contact Dr Juliann Pares and verify if more meds need to be given pre cath. Pt received  pepcid (at 2151 04/16/21) and prednisone ( 0600 04/17/21) already given last done over 6 hrs ago. No Benadryl ordered for cath just if needed. Aspirin 81 given at 6 am.

## 2021-04-17 NOTE — CV Procedure (Signed)
Brief post cath note  Patient was brought to the cardiac Cath Lab because of unstable angina right radial approach  Left ventricular function was normal at 60% Coronary Left main LAD circumflex are within normal limits RCA large no disease TR band applied  Conclusion no significant coronary disease normal left ventricular function Recommend conservative medical therapy Discharge later today Able to return to work within 48 hours Rest right wrist

## 2021-04-17 NOTE — TOC CM/SW Note (Signed)
Patient has orders to discharge home today. Chart reviewed. PCP is James Hedrick, MD. On room air. No wounds. No TOC needs identified. CSW signing off.  Joy Haegele, CSW [phone]  

## 2021-04-17 NOTE — Progress Notes (Signed)
[facility] Card[facility]    SUBJECTIVE: Patient doing reasonably well no significant chest pain status post cardiac cath.  No further chest pain.  Doing reasonably well no significant complications postprocedure   Vitals:   04/17/21 0004 04/17/21 0400 04/17/21 0730 04/17/21 1138  BP: (!) 123/91 116/77 136/82 138/78  Pulse: 75 83 94 94  Resp: 17 18 16 18   Temp: 98.2 F (36.8 C) 98.3 F (36.8 C) 98 F (36.7 C) 98.4 F (36.9 C)  TempSrc:  Oral Oral Oral  SpO2: 98% 98% 97% 97%  Weight:      Height:         Intake/Output Summary (Last 24 hours) at 04/17/2021 1203 Last data filed at 04/17/2021 1100 Gross per 24 hour  Intake 2339.52 ml  Output 1650 ml  Net 689.52 ml      PHYSICAL EXAM  General: Well developed, well nourished, in no acute distress HEENT:  Normocephalic and atramatic Neck:  No JVD.  Lungs: Clear bilaterally to auscultation and percussion. Heart: HRRR . Normal S1 and S2 without gallops or murmurs.  Abdomen: Bowel sounds are positive, abdomen soft and non-tender  Msk:  Back normal, normal gait. Normal strength and tone for age. Extremities: No clubbing, cyanosis or edema.   Neuro: Alert and oriented X 3. Psych:  Good affect, responds appropriately   LABS: Basic Metabolic Panel: Recent Labs    04/15/21 2258  NA 134*  K 3.7  CL 99  CO2 26  GLUCOSE 126*  BUN 21*  CREATININE 0.92  CALCIUM 9.3   Liver Function Tests: No results for input(s): AST, ALT, ALKPHOS, BILITOT, PROT, ALBUMIN in the last 72 hours. No results for input(s): LIPASE, AMYLASE in the last 72 hours. CBC: Recent Labs    04/15/21 2258  WBC 9.5  HGB 15.5  HCT 46.7  MCV 83.1  PLT 267   Cardiac Enzymes: No results for input(s): CKTOTAL, CKMB, CKMBINDEX, TROPONINI in the last 72 hours. BNP: Invalid input(s): POCBNP D-Dimer: No results for input(s): DDIMER in the last 72 hours. Hemoglobin A1C: No results for input(s): HGBA1C in the last 72 hours. Fasting Lipid Panel: No results for  input(s): CHOL, HDL, LDLCALC, TRIG, CHOLHDL, LDLDIRECT in the last 72 hours. Thyroid Function Tests: No results for input(s): TSH, T4TOTAL, T3FREE, THYROIDAB in the last 72 hours.  Invalid input(s): FREET3 Anemia Panel: No results for input(s): VITAMINB12, FOLATE, FERRITIN, TIBC, IRON, RETICCTPCT in the last 72 hours.  DG Chest 2 View  Result Date: 04/15/2021 CLINICAL DATA:  Chest pain EXAM: CHEST - 2 VIEW COMPARISON:  09/02/2013 FINDINGS: The heart size and mediastinal contours are within normal limits. Both lungs are clear. The visualized skeletal structures are unremarkable. IMPRESSION: No active cardiopulmonary disease. Electronically Signed   By: Inez Catalina M.D.   On: 04/15/2021 23:08       TELEMETRY: Normal sinus rhythm right bundle branch block rate of 70:  ASSESSMENT AND PLAN:  Principal Problem:   Nonspecific chest pain Active Problems:   RBBB Angina Obesity    Plan Status post cardiac cath No significant coronary disease on cardiac cath Normal overall left ventricular function EF of 60% Continue conservative medical management Atypical chest pain Recommend modest weight loss exercise portion control Have patient follow-up with cardiology 1 to 2-week Able to return to work 48 hours   Yolonda Kida, MD 04/17/2021 12:03 PM

## 2021-04-17 NOTE — Plan of Care (Signed)
°  Problem: Education: Goal: Knowledge of General Education information will improve Description: Including pain rating scale, medication(s)/side effects and non-pharmacologic comfort measures 04/17/2021 1843 by Emmaline Life, RN Outcome: Adequate for Discharge 04/17/2021 1059 by Emmaline Life, RN Outcome: Progressing   Problem: Health Behavior/Discharge Planning: Goal: Ability to manage health-related needs will improve 04/17/2021 1843 by Emmaline Life, RN Outcome: Adequate for Discharge 04/17/2021 1059 by Emmaline Life, RN Outcome: Progressing   Problem: Clinical Measurements: Goal: Ability to maintain clinical measurements within normal limits will improve Outcome: Adequate for Discharge Goal: Will remain free from infection Outcome: Adequate for Discharge Goal: Diagnostic test results will improve Outcome: Adequate for Discharge Goal: Respiratory complications will improve Outcome: Adequate for Discharge Goal: Cardiovascular complication will be avoided Outcome: Adequate for Discharge   Problem: Activity: Goal: Risk for activity intolerance will decrease Outcome: Adequate for Discharge

## 2021-04-17 NOTE — Discharge Summary (Signed)
Physician Discharge Summary  Patient ID: Kyle Wood MRN: [id number] DOB/AGE: 1966/01/28 56 y.o.  Admit date: 04/16/2021 Discharge date: 04/17/2021  Admission Diagnoses: Unstable angina   Discharge Diagnoses:  Principal Problem:   Nonspecific chest pain Active Problems:   RBBB   History of cardiac catheterization   Discharged Condition: good  Hospital Course:  Presented to ED 04/15/2021 with acute onset midsternal and right parasternal chest pain described as tightness/pressure, 10 out of 10 in severity, radiating to neck/jaw.  EMS administered aspirin and sublingual nitro which helped pain.  No nausea/vomiting, no abdominal pain, no dyspnea/cough, no leg pain/edema.  EKG showed normal sinus rhythm, right bundle branch block.  Chest x-ray normal.  Troponin x2 flat/WNL.  Patient admitted 04/16/2021, cardiology consulted, performed cardiac catheterization today 04/17/2021, per cardiology note "Left ventricular function was normal at 60% Coronary Left main LAD circumflex are within normal limits RCA large no disease TR band applied Conclusion no significant  oronary disease normal left ventricular function Recommend conservative medical therapy"   Consults: cardiology  Significant Diagnostic Studies:  Cardiac catheterization 04/17/2021    Discharge Exam: Blood pressure 121/82, pulse 85, temperature 98.1 F (36.7 C), temperature source Oral, resp. rate 12, height 5\' 7"  (1.702 m), weight 92.4 kg, SpO2 98 %. General appearance: alert, cooperative, and no distress Resp: clear to auscultation bilaterally Cardio: regular rate and rhythm, S1, S2 normal, no murmur, click, rub or gallop GI: soft, non-tender;  no masses,  no organomegaly  Disposition: Discharge disposition: 01-Home or Self Care     home to self care   Outpatient follow up: PCP in 1-2 weeks Cardiology follow-up in 2-4 weeks   Discharge Instructions     Diet - low sodium heart healthy   Complete by: As directed     Increase activity slowly   Complete by: As directed       Allergies as of 04/17/2021       Reactions   Ivp Dye [iodinated Contrast Media] Shortness Of Breath, Swelling, Other (See Comments)   "Throat started closing up"   Ceftin [cefuroxime Axetil] Nausea Only        Medication List     TAKE these medications    aspirin 81 MG EC tablet Take 1 tablet (81 mg total) by mouth daily. Swallow whole. Start taking on: April 18, 2021   meloxicam 15 MG tablet Commonly known as: MOBIC Take 15 mg by mouth daily.   nitroGLYCERIN 0.4 MG SL tablet Commonly known as: NITROSTAT Place 1 tablet (0.4 mg total) under the tongue every 5 (five) minutes as needed for chest pain.       DG Chest 2 View  Result Date: 04/15/2021 CLINICAL DATA:  Chest pain EXAM: CHEST - 2 VIEW COMPARISON:  09/02/2013 FINDINGS: The heart size and mediastinal contours are within normal limits. Both lungs are clear. The visualized skeletal structures are unremarkable. IMPRESSION: No active cardiopulmonary disease. Electronically Signed   By: Alcide CleverMark  Lukens M.D.   On: 04/15/2021 23:08   Results for orders placed or performed during the hospital encounter of 04/16/21 (from the past 72 hour(s))  Basic metabolic panel     Status: Abnormal   Collection Time: 04/15/21 10:58 PM  Result Value Ref Range   Sodium 134 (L) 135 - 145 mmol/L   Potassium 3.7 3.5 - 5.1 mmol/L   Chloride 99 98 - 111 mmol/L   CO2 26 22 - 32 mmol/L   Glucose, Bld 126 (H) 70 - 99 mg/dL  Comment: Glucose reference range applies only to samples taken after fasting for at least 8 hours.   BUN 21 (H) 6 - 20 mg/dL   Creatinine, Ser 0.48 0.61 - 1.24 mg/dL   Calcium 9.3 8.9 - 88.9 mg/dL   GFR, Estimated >16 >94 mL/min    Comment: (NOTE) Calculated using the CKD-EPI Creatinine Equation (2021)    Anion gap 9 5 - 15    Comment: Performed at [facility], [address] [place], [place] [address]  CBC     Status: None   Collection Time:  04/15/21 10:58 PM  Result Value Ref Range   WBC 9.5 4.0 - 10.5 K/uL   RBC 5.62 4.22 - 5.81 MIL/uL   Hemoglobin 15.5 13.0 - 17.0 g/dL   HCT 82.8 00.3 - 49.1 %   MCV 83.1 80.0 - 100.0 fL   MCH 27.6 26.0 - 34.0 pg   MCHC 33.2 30.0 - 36.0 g/dL   RDW 79.1 50.5 - 69.7 %   Platelets 267 150 - 400 K/uL   nRBC 0.0 0.0 - 0.2 %    Comment: Performed at [facility], [address], [place], [place] [address]  Troponin I (High Sensitivity)     Status: None   Collection Time: 04/15/21 10:58 PM  Result Value Ref Range   Troponin I (High Sensitivity) 5 <18 ng/L    Comment: (NOTE) Elevated high sensitivity troponin I (hsTnI) values and significant  changes across serial measurements may suggest ACS but many other  chronic and acute conditions are known to elevate hsTnI results.  Refer to the "Links" section for chest pain algorithms and additional  guidance. Performed at [facility], [address] [place], [place] [address]   Troponin I (High Sensitivity)     Status: None   Collection Time: 04/16/21  1:08 AM  Result Value Ref Range   Troponin I (High Sensitivity) 4 <18 ng/L    Comment: (NOTE) Elevated high sensitivity troponin I (hsTnI) values and significant  changes across serial measurements may suggest ACS but many other  chronic and acute conditions are known to elevate hsTnI results.  Refer to the "Links" section for chest pain algorithms and additional  guidance. Performed at [facility], [address] [place], [place] [address]   HIV Antibody (routine testing w rflx)     Status: None   Collection Time: 04/16/21  7:33 AM  Result Value Ref Range   HIV Screen 4th Generation wRfx Non Reactive Non Reactive    Comment: Performed at [facility], [address] [place], [place] [address]  Resp Panel by RT-PCR (Flu A&B, Covid) Nasopharyngeal Swab     Status: None   Collection Time: 04/16/21  4:05 PM   Specimen: Nasopharyngeal Swab;  Nasopharyngeal(NP) swabs in vial transport medium  Result Value Ref Range   SARS Coronavirus 2 by RT PCR NEGATIVE NEGATIVE    Comment: (NOTE) SARS-CoV-2 target nucleic acids are NOT DETECTED.  The SARS-CoV-2 RNA is generally detectable in upper respiratory specimens during the acute phase of infection. The lowest concentration of SARS-CoV-2 viral copies this assay can detect is 138 copies/mL. A negative result does not preclude SARS-Cov-2 infection and should not be used as the sole basis for treatment or other patient management decisions. A negative result may occur with  improper specimen collection/handling, submission of specimen other than nasopharyngeal swab, presence of viral mutation(s) within the areas targeted by this assay, and inadequate number of viral copies(<138  copies/mL). A negative result must be combined with clinical observations, patient history, and epidemiological information. The expected result is Negative.  Fact Sheet for Patients:  BloggerCourse.com  Fact Sheet for Healthcare Providers:  SeriousBroker.it  This test is no t yet approved or cleared by the [place] FDA and  has been authorized for detection and/or diagnosis of SARS-CoV-2 by FDA under an Emergency Use Authorization (EUA). This EUA will remain  in effect (meaning this test can be used) for the duration of the COVID-19 declaration under Section 564(b)(1) of the Act, 21 U.S.C.section 360bbb-3(b)(1), unless the authorization is terminated  or revoked sooner.       Influenza A by PCR NEGATIVE NEGATIVE   Influenza B by PCR NEGATIVE NEGATIVE    Comment: (NOTE) The Xpert Xpress SARS-CoV-2/FLU/RSV plus assay is intended as an aid in the diagnosis of influenza from Nasopharyngeal swab specimens and should not be used as a sole basis for treatment. Nasal washings and aspirates are unacceptable for Xpert Xpress  SARS-CoV-2/FLU/RSV testing.  Fact Sheet for Patients: BloggerCourse.com  Fact Sheet for Healthcare Providers: SeriousBroker.it  This test is not yet approved or cleared by the [place] FDA and has been authorized for detection and/or diagnosis of SARS-CoV-2 by FDA under an Emergency Use Authorization (EUA). This EUA will remain in effect (meaning this test can be used) for the duration of the COVID-19 declaration under Section 564(b)(1) of the Act, 21 U.S.C. section 360bbb-3(b)(1), unless the authorization is terminated or revoked.  Performed at [facility], [address], [place], [place] [address]      Signed: Sunnie Nielsen 04/17/2021, 4:53 PM

## 2021-04-18 ENCOUNTER — Encounter: Payer: Self-pay | Admitting: Internal Medicine

## 2021-04-18 LAB — CARDIAC CATHETERIZATION: Cath EF Quantitative: 60 %

## 2021-05-16 ENCOUNTER — Other Ambulatory Visit: Payer: Self-pay | Admitting: Osteopathic Medicine

## 2022-08-31 ENCOUNTER — Other Ambulatory Visit: Payer: Self-pay

## 2022-08-31 ENCOUNTER — Emergency Department: Payer: BC Managed Care – PPO

## 2022-08-31 ENCOUNTER — Emergency Department
Admission: EM | Admit: 2022-08-31 | Discharge: 2022-08-31 | Disposition: A | Discharge status: Home / Self Care | Payer: BC Managed Care – PPO | Attending: Emergency Medicine | Admitting: Emergency Medicine

## 2022-08-31 ENCOUNTER — Encounter: Payer: Self-pay | Admitting: Emergency Medicine

## 2022-08-31 DIAGNOSIS — F17200 Nicotine dependence, unspecified, uncomplicated: Secondary | ICD-10-CM | POA: Insufficient documentation

## 2022-08-31 DIAGNOSIS — R42 Dizziness and giddiness: Secondary | ICD-10-CM | POA: Diagnosis present

## 2022-08-31 LAB — URINALYSIS, ROUTINE W REFLEX MICROSCOPIC
Bilirubin Urine: NEGATIVE
Glucose, UA: NEGATIVE mg/dL
Ketones, ur: NEGATIVE mg/dL
Leukocytes,Ua: NEGATIVE
Nitrite: NEGATIVE
Protein, ur: NEGATIVE mg/dL
Specific Gravity, Urine: 1.012 (ref 1.005–1.030)
Squamous Epithelial / HPF: NONE SEEN /HPF (ref 0–5)
pH: 5 (ref 5.0–8.0)

## 2022-08-31 LAB — BASIC METABOLIC PANEL
Anion gap: 11 (ref 5–15)
BUN: 25 mg/dL — ABNORMAL HIGH (ref 6–20)
CO2: 20 mmol/L — ABNORMAL LOW (ref 22–32)
Calcium: 9.1 mg/dL (ref 8.9–10.3)
Chloride: 105 mmol/L (ref 98–111)
Creatinine, Ser: 0.87 mg/dL (ref 0.61–1.24)
GFR, Estimated: >60 mL/min (ref >60)
Glucose, Bld: 126 mg/dL — ABNORMAL HIGH (ref 70–99)
Potassium: 4.1 mmol/L (ref 3.5–5.1)
Sodium: 136 mmol/L (ref 135–145)

## 2022-08-31 LAB — CBC
HCT: 50.2 % (ref 39.0–52.0)
Hemoglobin: 16.2 g/dL (ref 13.0–17.0)
MCH: 27.6 pg (ref 26.0–34.0)
MCHC: 32.3 g/dL (ref 30.0–36.0)
MCV: 85.7 fL (ref 80.0–100.0)
Platelets: 271 10*3/uL (ref 150–400)
RBC: 5.86 MIL/uL — ABNORMAL HIGH (ref 4.22–5.81)
RDW: 13.1 % (ref 11.5–15.5)
WBC: 11.3 10*3/uL — ABNORMAL HIGH (ref 4.0–10.5)
nRBC: 0 % (ref 0.0–0.2)

## 2022-08-31 LAB — CBG MONITORING, ED: Glucose-Capillary: 123 mg/dL — ABNORMAL HIGH (ref 70–99)

## 2022-08-31 MED ORDER — ONDANSETRON 4 MG PO TBDP: 4.0000 mg | ORAL_TABLET | Freq: Three times a day (TID) | ORAL | 0 refills | Status: AC | PRN

## 2022-08-31 MED ORDER — MECLIZINE HCL 25 MG PO TABS: 25.0000 mg | ORAL_TABLET | Freq: Three times a day (TID) | ORAL | 0 refills | Status: AC | PRN

## 2022-08-31 MED ORDER — SODIUM CHLORIDE 0.9 % IV SOLN: Freq: Once | INTRAVENOUS | Status: AC

## 2022-08-31 MED ORDER — ONDANSETRON HCL 4 MG/2ML IJ SOLN
4.0000 mg | Freq: Once | INTRAMUSCULAR | Status: AC
  Administered 2022-08-31: 4 mg via INTRAVENOUS
  Filled 2022-08-31: qty 2

## 2022-08-31 NOTE — ED Notes (Signed)
Patient given a urinal.

## 2022-08-31 NOTE — ED Triage Notes (Signed)
Patient to ED via POV for dizziness since 1am. Patient states he didn't feel that he could drive today due to the dizziness. Also having nausea.

## 2022-08-31 NOTE — ED Provider Notes (Signed)
[facility] Emergency Dept Provider Note    Event Date/Time   First MD Initiated Contact with Patient 08/31/22 [phone]     (approximate)   History   Dizziness   HPI  Kyle Wood is a 57 y.o. male with a history of tobacco use, who presents with dizziness.  Patient reports he woke up in the middle of the night to go to the bathroom, felt that the room was moving a little bit, went back to sleep, symptoms continued at 4 AM when he got up for work.  Denies headache.  No new neurodeficits.  Worse with movement of the head.  No history of vertigo.  No ear pain, no tinnitus     Physical Exam   Triage Vital Signs: ED Triage Vitals  Enc Vitals Group     BP 08/31/22 0822 (!) 146/82     Pulse Rate 08/31/22 0822 62     Resp 08/31/22 0822 18     Temp 08/31/22 0822 97.8 F (36.6 C)     Temp Source 08/31/22 0822 Oral     SpO2 08/31/22 0822 100 %     Weight --      Height --      Head Circumference --      Peak Flow --      Pain Score 08/31/22 0821 0     Pain Loc --      Pain Edu? --      Excl. in GC? --     Most recent vital signs: Vitals:   08/31/22 0930 08/31/22 0945  BP: (!) 140/70   Pulse: 60 (!) 59  Resp: 11 15  Temp:    SpO2: 100% 100%     General: Awake, no distress.  CV:  Good peripheral perfusion.  Resp:  Normal effort.  Abd:  No distention.  Other:  Normal neuroexam, cranial nerves II through XII are normal.  Mild right nystagmus   ED Results / Procedures / Treatments   Labs (all labs ordered are listed, but only abnormal results are displayed) Labs Reviewed  BASIC METABOLIC PANEL - Abnormal; Notable for the following components:      Result Value   CO2 20 (*)    Glucose, Bld 126 (*)    BUN 25 (*)    All other components within normal limits  CBC - Abnormal; Notable for the following components:   WBC 11.3 (*)    RBC 5.86 (*)    All other components within normal limits  URINALYSIS, ROUTINE W REFLEX MICROSCOPIC - Abnormal; Notable for  the following components:   Color, Urine YELLOW (*)    APPearance CLEAR (*)    Hgb urine dipstick SMALL (*)    Bacteria, UA RARE (*)    All other components within normal limits  CBG MONITORING, ED - Abnormal; Notable for the following components:   Glucose-Capillary 123 (*)    All other components within normal limits     EKG  ED ECG REPORT I, Jene Every, the attending physician, personally viewed and interpreted this ECG.  Date: 08/31/2022  Rhythm: normal sinus rhythm QRS Axis: normal Intervals: Right bundle branch block ST/T Wave abnormalities: normal Narrative Interpretation: no evidence of acute ischemia    RADIOLOGY     PROCEDURES:  Critical Care performed:   Procedures   MEDICATIONS ORDERED IN ED: Medications  0.9 %  sodium chloride infusion (0 mLs Intravenous Stopped 08/31/22 1015)  ondansetron (ZOFRAN) injection 4 mg (4 mg Intravenous  Given 08/31/22 0857)     IMPRESSION / MDM / ASSESSMENT AND PLAN / ED COURSE  I reviewed the triage vital signs and the nursing notes. Patient's presentation is most consistent with acute presentation with potential threat to life or bodily function.  Patient presents with dizziness, likely vertigo as described above.  Differential includes benign positional vertigo, less likely central vertigo, no headaches, no neurodeficits.  No dysdiadochokinesis.  Rightward nystagmus  Strongly suspect BPV, will obtain labs, treat with IV fluids, IV Zofran, obtain CT head and reevaluate.  CT scan is reassuring, lab work is unremarkable, patient feeling improved after treatment, appropriate for discharge at this time with supportive care, outpatient follow-up with ENT, return precautions discussed, patient agrees with this plan.      FINAL CLINICAL IMPRESSION(S) / ED DIAGNOSES   Final diagnoses:  Vertigo     Rx / DC Orders   ED Discharge Orders          Ordered    meclizine (ANTIVERT) 25 MG tablet  3 times daily PRN         08/31/22 1028    ondansetron (ZOFRAN-ODT) 4 MG disintegrating tablet  Every 8 hours PRN        08/31/22 1028             Note:  This document was prepared using Dragon voice recognition software and may include unintentional dictation errors.   Jene Every, MD 08/31/22 1100

## 2022-12-02 IMAGING — CR DG CHEST 2V
1 series · 2 of 2 positions shown · non-contrast
Comparison: 09/02/2013

CLINICAL DATA: Chest pain

EXAM:
CHEST - 2 VIEW

[Series 1: dg chest 2 view · 0.14mm/px · 2 of 2 slices shown]
[im 1/2]
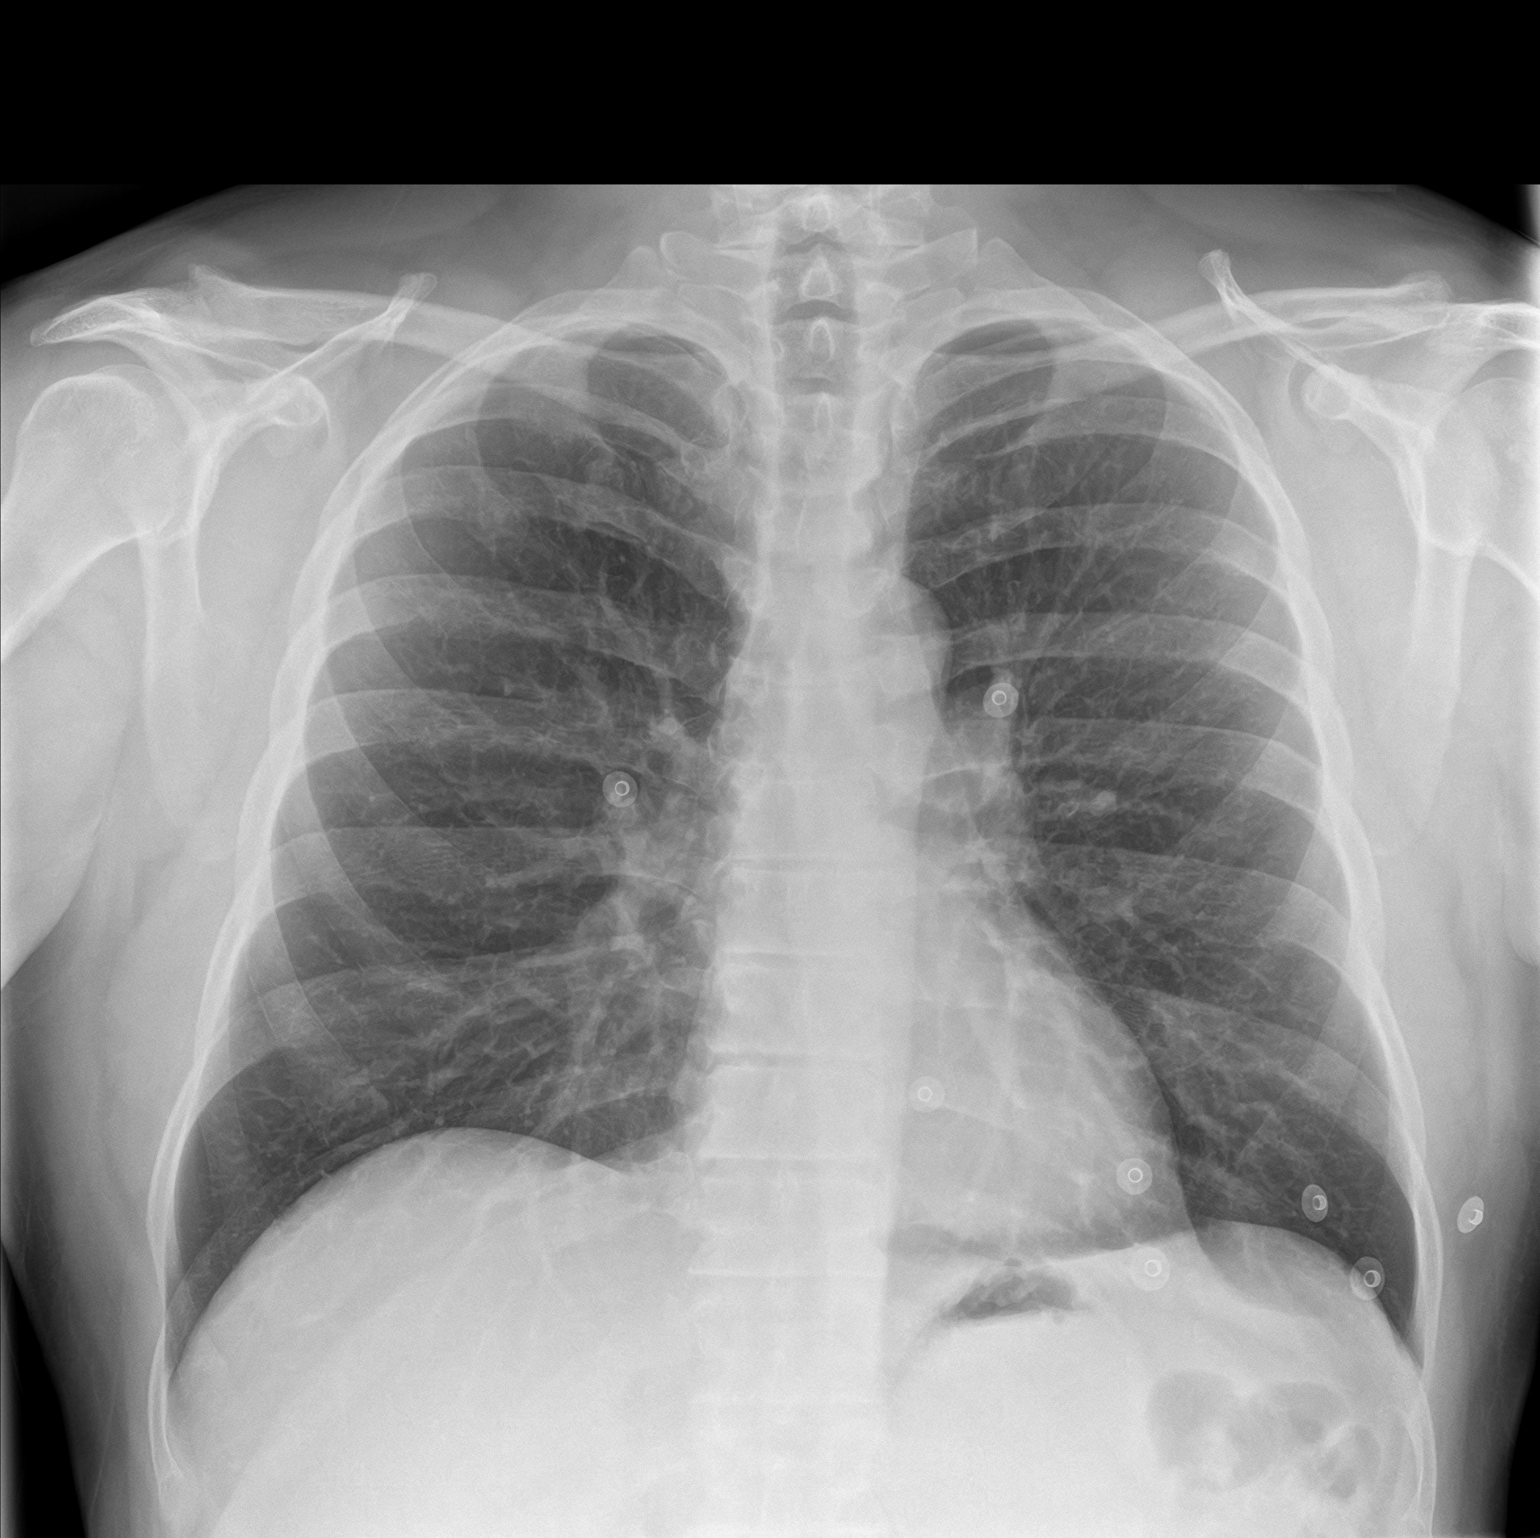
[im 2/2]
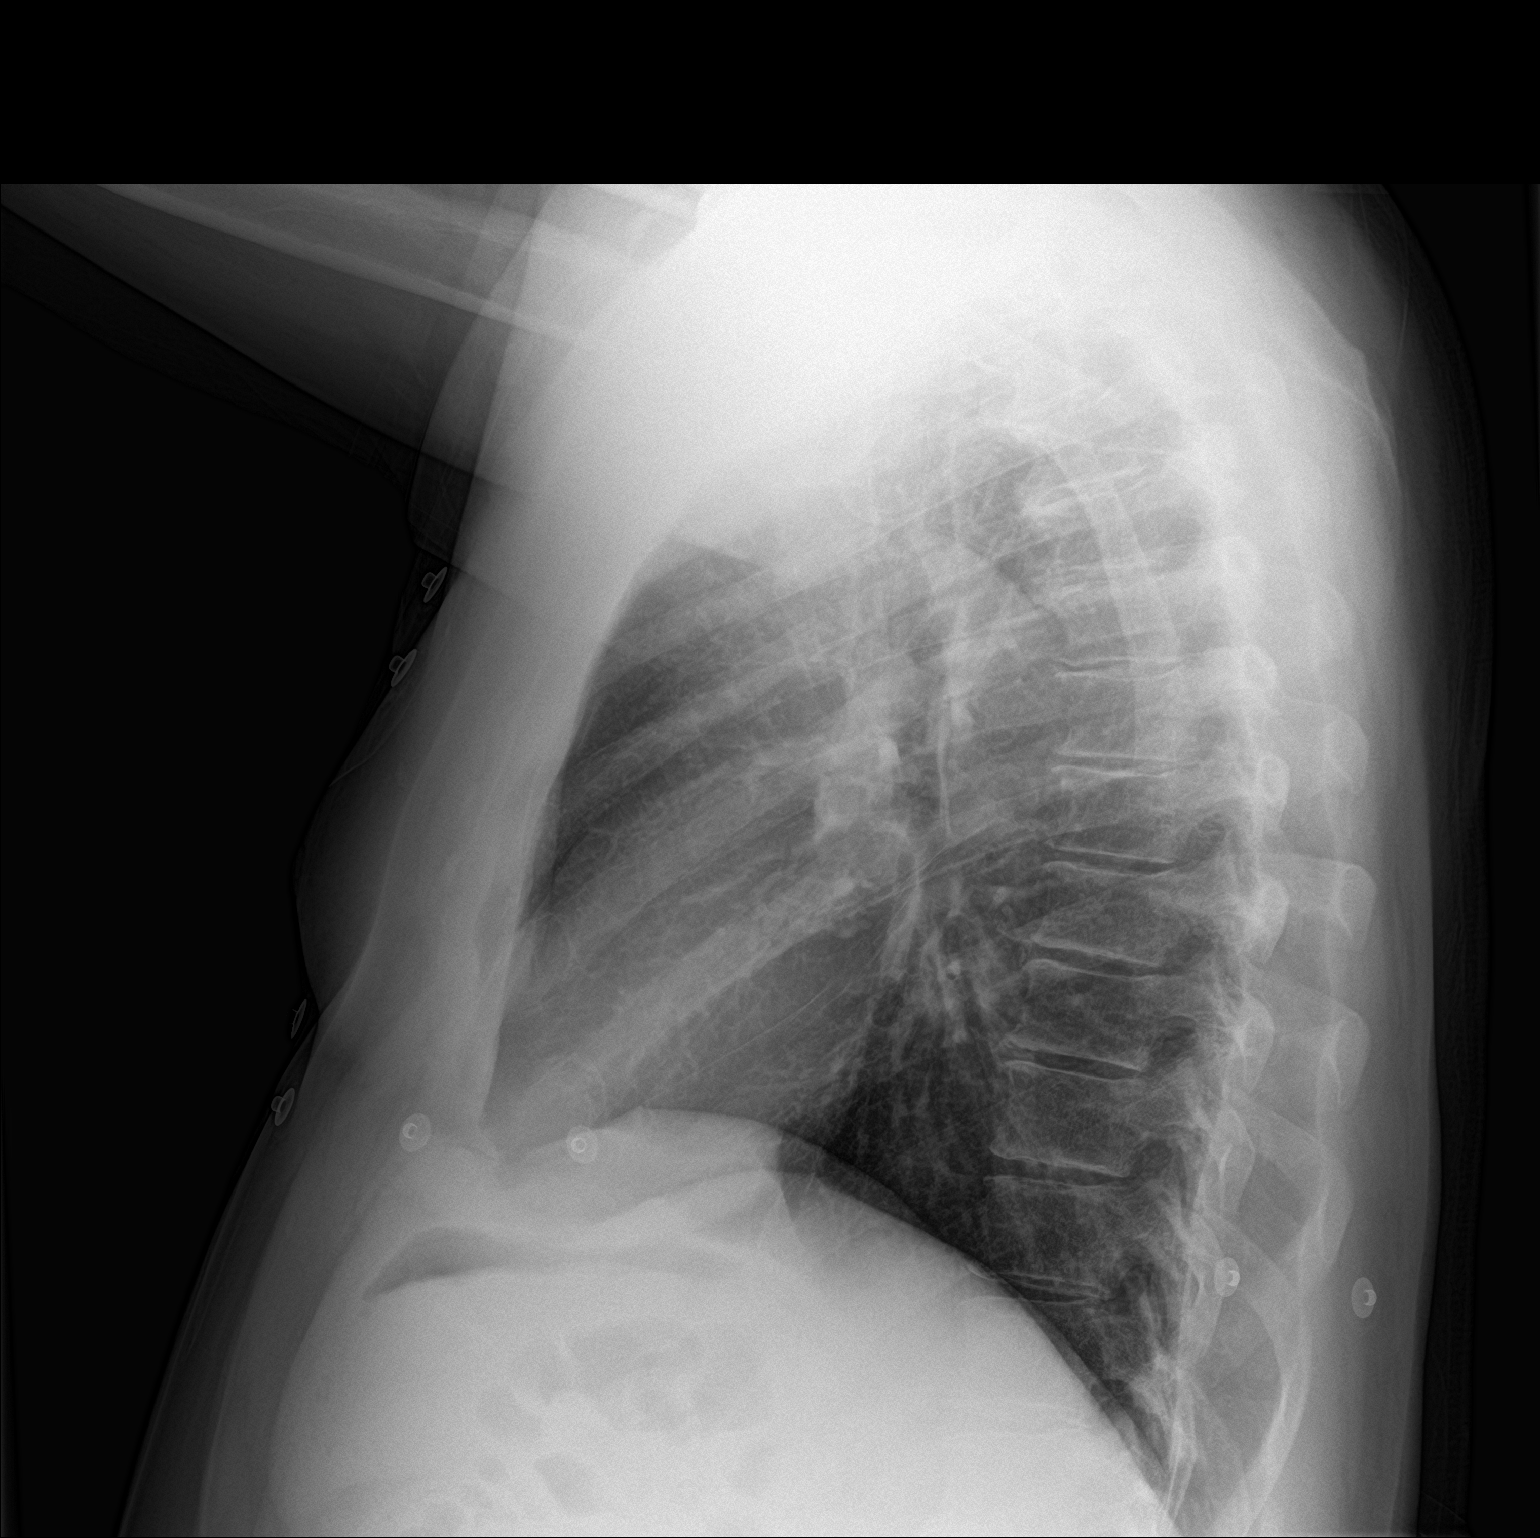

[2 of 2 positions shown; findings below may reference images not displayed]

FINDINGS: The heart size and mediastinal contours are within normal limits.
Both lungs are clear. The visualized skeletal structures are
unremarkable.
IMPRESSION: No active cardiopulmonary disease.

## 2023-05-24 ENCOUNTER — Other Ambulatory Visit: Payer: Self-pay

## 2023-05-24 ENCOUNTER — Emergency Department
Admission: EM | Admit: 2023-05-24 | Discharge: 2023-05-24 | Disposition: A | Discharge status: Home / Self Care | Attending: Emergency Medicine | Admitting: Emergency Medicine

## 2023-05-24 ENCOUNTER — Emergency Department

## 2023-05-24 DIAGNOSIS — R109 Unspecified abdominal pain: Secondary | ICD-10-CM | POA: Diagnosis present

## 2023-05-24 LAB — URINALYSIS, ROUTINE W REFLEX MICROSCOPIC
Bilirubin Urine: NEGATIVE
Glucose, UA: NEGATIVE mg/dL
Hgb urine dipstick: NEGATIVE
Ketones, ur: NEGATIVE mg/dL
Leukocytes,Ua: NEGATIVE
Nitrite: NEGATIVE
Protein, ur: 30 mg/dL — AB
Specific Gravity, Urine: 1.025 (ref 1.005–1.030)
Squamous Epithelial / HPF: 0 /HPF (ref 0–5)
pH: 6 (ref 5.0–8.0)

## 2023-05-24 LAB — BASIC METABOLIC PANEL
Anion gap: 9 (ref 5–15)
BUN: 17 mg/dL (ref 6–20)
CO2: 26 mmol/L (ref 22–32)
Calcium: 9.2 mg/dL (ref 8.9–10.3)
Chloride: 102 mmol/L (ref 98–111)
Creatinine, Ser: 0.96 mg/dL (ref 0.61–1.24)
GFR, Estimated: >60 mL/min (ref >60)
Glucose, Bld: 111 mg/dL — ABNORMAL HIGH (ref 70–99)
Potassium: 4.4 mmol/L (ref 3.5–5.1)
Sodium: 137 mmol/L (ref 135–145)

## 2023-05-24 LAB — CBC
HCT: 48 % (ref 39.0–52.0)
Hemoglobin: 15.6 g/dL (ref 13.0–17.0)
MCH: 27.5 pg (ref 26.0–34.0)
MCHC: 32.5 g/dL (ref 30.0–36.0)
MCV: 84.5 fL (ref 80.0–100.0)
Platelets: 276 10*3/uL (ref 150–400)
RBC: 5.68 MIL/uL (ref 4.22–5.81)
RDW: 12.9 % (ref 11.5–15.5)
WBC: 7.2 10*3/uL (ref 4.0–10.5)
nRBC: 0 % (ref 0.0–0.2)

## 2023-05-24 LAB — LIPASE, BLOOD: Lipase: 34 U/L (ref 11–51)

## 2023-05-24 LAB — HEPATIC FUNCTION PANEL
ALT: 25 U/L (ref 0–44)
AST: 21 U/L (ref 15–41)
Albumin: 4.2 g/dL (ref 3.5–5.0)
Alkaline Phosphatase: 42 U/L (ref 38–126)
Bilirubin, Direct: <0.1 mg/dL (ref 0.0–0.2)
Total Bilirubin: 0.8 mg/dL (ref 0.0–1.2)
Total Protein: 7.5 g/dL (ref 6.5–8.1)

## 2023-05-24 MED ORDER — FENTANYL CITRATE PF 50 MCG/ML IJ SOSY
50.0000 ug | PREFILLED_SYRINGE | Freq: Once | INTRAMUSCULAR | Status: AC
  Administered 2023-05-24: 50 ug via INTRAVENOUS
  Filled 2023-05-24: qty 1

## 2023-05-24 MED ORDER — TRAMADOL HCL 50 MG PO TABS: 50.0000 mg | ORAL_TABLET | Freq: Four times a day (QID) | ORAL | 0 refills | Status: AC | PRN

## 2023-05-24 MED ORDER — MORPHINE SULFATE (PF) 4 MG/ML IV SOLN
4.0000 mg | Freq: Once | INTRAVENOUS | Status: AC
  Administered 2023-05-24: 4 mg via INTRAVENOUS
  Filled 2023-05-24: qty 1

## 2023-05-24 MED ORDER — NAPROXEN 500 MG PO TABS: 500.0000 mg | ORAL_TABLET | Freq: Two times a day (BID) | ORAL | 0 refills | Status: AC

## 2023-05-24 MED ORDER — METHOCARBAMOL 500 MG PO TABS: 500.0000 mg | ORAL_TABLET | Freq: Three times a day (TID) | ORAL | 0 refills | Status: AC | PRN

## 2023-05-24 MED ORDER — ONDANSETRON HCL 4 MG/2ML IJ SOLN
4.0000 mg | Freq: Once | INTRAMUSCULAR | Status: AC
  Administered 2023-05-24: 4 mg via INTRAVENOUS
  Filled 2023-05-24: qty 2

## 2023-05-24 MED ORDER — SODIUM CHLORIDE 0.9 % IV BOLUS
500.0000 mL | Freq: Once | INTRAVENOUS | Status: AC
  Administered 2023-05-24: 500 mL via INTRAVENOUS

## 2023-05-24 NOTE — ED Notes (Signed)
 See triage note  Presents with pain to right flank and lateral abd  States this started coupe of days ago  States this am pain is moving into right abd

## 2023-05-24 NOTE — ED Triage Notes (Signed)
 Pt to ED for right flank pain x2 days. Denies n/v/d, urinary sx.

## 2023-05-24 NOTE — ED Provider Notes (Signed)
 [facility] Provider Note    Event Date/Time   First MD Initiated Contact with Patient 05/24/23 [phone]     (approximate)   History   Flank Pain   HPI  Kyle Wood is a 58 y.o. male  with history of appendectomy and as listed in EMR presents to the emergency department for treatment and evaluation of right flank pain. Symptoms started yesterday, but much worse this morning. Pain radiates from right flank around to right abdomen. He was only able to urinate a small amount this morning, but didn't notice any blood. Pain worsens with movement. He denies chest pain, dyspnea, nausea, vomiting, diarrhea, or constipation. No alleviating measures prior to arrival.      Physical Exam   Triage Vital Signs: ED Triage Vitals  Encounter Vitals Group     BP 05/24/23 0726 (!) 130/91     Systolic BP Percentile --      Diastolic BP Percentile --      Pulse Rate 05/24/23 0726 87     Resp 05/24/23 0726 18     Temp 05/24/23 0726 97.9 F (36.6 C)     Temp src --      SpO2 05/24/23 0726 99 %     Weight 05/24/23 0724 210 lb (95.3 kg)     Height 05/24/23 0724 5\' 7"  (1.702 m)     Head Circumference --      Peak Flow --      Pain Score 05/24/23 0724 6     Pain Loc --      Pain Education --      Exclude from Growth Chart --     Most recent vital signs: Vitals:   05/24/23 0726  BP: (!) 130/91  Pulse: 87  Resp: 18  Temp: 97.9 F (36.6 C)  SpO2: 99%    General: Awake, no distress. Appears uncomfortable. CV:  Good peripheral perfusion.  Resp:  Normal effort.  Abd:  No distention. Pain in RLQ with palpation.  Other:  Positive CVA tenderness on right. Pain in right flank with movement/position change.   ED Results / Procedures / Treatments   Labs (all labs ordered are listed, but only abnormal results are displayed) Labs Reviewed  URINALYSIS, ROUTINE W REFLEX MICROSCOPIC - Abnormal; Notable for the following components:      Result Value   Color, Urine  YELLOW (*)    APPearance HAZY (*)    Protein, ur 30 (*)    Bacteria, UA RARE (*)    All other components within normal limits  BASIC METABOLIC PANEL - Abnormal; Notable for the following components:   Glucose, Bld 111 (*)    All other components within normal limits  URINE CULTURE  CBC  HEPATIC FUNCTION PANEL  LIPASE, BLOOD     EKG  Not indicated.   RADIOLOGY  Image and radiology report reviewed and interpreted by me. Radiology report consistent with the same.  Small fat containing inguinal hernia on left. No acute findings on CT.  PROCEDURES:  Critical Care performed: No  Procedures   MEDICATIONS ORDERED IN ED:  Medications  fentaNYL (SUBLIMAZE) injection 50 mcg (50 mcg Intravenous Given 05/24/23 0808)  ondansetron (ZOFRAN) injection 4 mg (4 mg Intravenous Given 05/24/23 0807)  sodium chloride 0.9 % bolus 500 mL (0 mLs Intravenous Stopped 05/24/23 1111)  morphine (PF) 4 MG/ML injection 4 mg (4 mg Intravenous Given 05/24/23 0929)     IMPRESSION / MDM / ASSESSMENT AND PLAN /  ED COURSE   I have reviewed the triage note.  Differential diagnosis includes, but is not limited to, kidney stone, musculoskeletal strain, pyelonephritis  Patient's presentation is most consistent with acute presentation with potential threat to life or bodily function.  58 year old male presenting to the emergency department for treatment and evaluation of right flank pain.  See HPI for further details.  Patient states that he has never had pain similar to this in the past.  No history of kidney stone.  He does have some CVA tenderness on exam and pain pattern consistent with renal colic.  Plan will be to get a CT renal stone study and give pain and nausea medication.  ----------------------------------------- 10:00 AM on 05/24/2023 ----------------------------------------- Patient had reported pain returning. Morphine ordered and given. He is now feeling better and denies nausea. Awaiting  radiology reading of CT and additional labs. Patient and family updated.  ----------------------------------------- 11:08 AM on 05/24/2023 ----------------------------------------- Lab studies are essentially unremarkable.  No leukocytosis.  Hepatic function is normal.  Electrolytes and kidney function is normal.  Urinalysis does show some rare bacteria with 30 of protein but otherwise reassuring.  Urine culture added due to presence of bacteria and patient's report of only urinating a small amount this morning and has not felt the need to urinate since.  CT of the abdomen pelvis is negative for acute concerns.  He does have a small fat-containing inguinal hernia on the left side.  Results discussed with the patient and family.  Plan will be to treat him for muscle strain.  He does work in a [employer] and states that he may have "pulled a muscle."  He does not recall any specific point of pain or injury.  Prescription submitted to his pharmacy.  He was advised to follow-up with his primary care provider if not improving over the next few days.  Work excuse provided and ER return precautions discussed.        FINAL CLINICAL IMPRESSION(S) / ED DIAGNOSES   Final diagnoses:  Right flank pain     Rx / DC Orders   ED Discharge Orders          Ordered    naproxen (NAPROSYN) 500 MG tablet  2 times daily with meals        05/24/23 1101    methocarbamol (ROBAXIN) 500 MG tablet  Every 8 hours PRN        05/24/23 1101    traMADol (ULTRAM) 50 MG tablet  Every 6 hours PRN        05/24/23 1101             Note:  This document was prepared using Dragon voice recognition software and may include unintentional dictation errors.   Chinita Pester, FNP 05/24/23 1113    Delton [place], MD 05/24/23 1452

## 2023-05-25 LAB — URINE CULTURE: Culture: NO GROWTH
